# Patient Record
Sex: Female | Born: 1975 | Hispanic: Yes | Marital: Married | Smoking: Never smoker
Health system: Southern US, Community
[De-identification: ages and names within clinical notes are randomized; demographics above are authoritative.]

## PROBLEM LIST (undated history)

## (undated) DIAGNOSIS — G43909 Migraine, unspecified, not intractable, without status migrainosus: Secondary | ICD-10-CM

---

## 2004-05-04 ENCOUNTER — Emergency Department: Payer: Self-pay | Admitting: Emergency Medicine

## 2004-09-16 ENCOUNTER — Emergency Department: Payer: Self-pay | Admitting: Emergency Medicine

## 2004-10-08 ENCOUNTER — Emergency Department: Payer: Self-pay | Admitting: Emergency Medicine

## 2005-03-25 ENCOUNTER — Ambulatory Visit: Payer: Self-pay | Admitting: Urology

## 2005-05-09 ENCOUNTER — Observation Stay: Payer: Self-pay

## 2005-05-16 ENCOUNTER — Inpatient Hospital Stay: Payer: Self-pay | Admitting: Obstetrics and Gynecology

## 2006-06-23 ENCOUNTER — Encounter: Payer: Self-pay | Admitting: Specialist

## 2006-06-26 ENCOUNTER — Encounter: Payer: Self-pay | Admitting: Specialist

## 2007-01-11 ENCOUNTER — Emergency Department: Payer: Self-pay | Admitting: Emergency Medicine

## 2007-11-20 ENCOUNTER — Emergency Department: Payer: Self-pay | Admitting: Unknown Physician Specialty

## 2008-03-12 ENCOUNTER — Emergency Department: Payer: Self-pay | Admitting: Emergency Medicine

## 2008-07-21 ENCOUNTER — Ambulatory Visit: Payer: Self-pay | Admitting: Family Medicine

## 2008-09-23 ENCOUNTER — Emergency Department: Payer: Self-pay | Admitting: Emergency Medicine

## 2008-12-04 ENCOUNTER — Observation Stay: Payer: Self-pay

## 2008-12-18 ENCOUNTER — Observation Stay: Payer: Self-pay | Admitting: Obstetrics and Gynecology

## 2008-12-22 ENCOUNTER — Observation Stay: Payer: Self-pay

## 2009-01-12 ENCOUNTER — Observation Stay: Payer: Self-pay | Admitting: Obstetrics and Gynecology

## 2009-01-30 ENCOUNTER — Observation Stay: Payer: Self-pay | Admitting: Obstetrics and Gynecology

## 2009-02-11 ENCOUNTER — Inpatient Hospital Stay: Payer: Self-pay | Admitting: Obstetrics and Gynecology

## 2009-02-19 ENCOUNTER — Emergency Department: Payer: Self-pay

## 2011-12-25 ENCOUNTER — Other Ambulatory Visit: Payer: Self-pay | Admitting: Physician Assistant

## 2011-12-25 LAB — MAGNESIUM: Magnesium: 1.9 mg/dL

## 2012-06-26 ENCOUNTER — Emergency Department: Payer: Self-pay | Admitting: Emergency Medicine

## 2012-06-26 LAB — BASIC METABOLIC PANEL
Anion Gap: 5 — ABNORMAL LOW (ref 7–16)
Calcium, Total: 8.4 mg/dL — ABNORMAL LOW (ref 8.5–10.1)
Chloride: 107 mmol/L (ref 98–107)
Creatinine: 0.5 mg/dL — ABNORMAL LOW (ref 0.60–1.30)
Glucose: 117 mg/dL — ABNORMAL HIGH (ref 65–99)
Potassium: 3 mmol/L — ABNORMAL LOW (ref 3.5–5.1)
Sodium: 138 mmol/L (ref 136–145)

## 2012-06-26 LAB — CBC
HCT: 33.6 % — ABNORMAL LOW (ref 35.0–47.0)
MCH: 24.3 pg — ABNORMAL LOW (ref 26.0–34.0)
MCHC: 32.6 g/dL (ref 32.0–36.0)
MCV: 75 fL — ABNORMAL LOW (ref 80–100)
Platelet: 230 10*3/uL (ref 150–440)
RDW: 16.4 % — ABNORMAL HIGH (ref 11.5–14.5)

## 2012-07-02 ENCOUNTER — Ambulatory Visit: Payer: Self-pay | Admitting: General Practice

## 2012-10-11 ENCOUNTER — Emergency Department: Payer: Self-pay | Admitting: Emergency Medicine

## 2014-08-29 ENCOUNTER — Encounter: Payer: Self-pay | Admitting: *Deleted

## 2015-05-18 ENCOUNTER — Ambulatory Visit: Payer: Self-pay | Admitting: Physician Assistant

## 2015-07-16 ENCOUNTER — Encounter: Payer: Self-pay | Admitting: Physician Assistant

## 2015-07-16 ENCOUNTER — Ambulatory Visit: Payer: Self-pay | Admitting: Physician Assistant

## 2015-07-16 VITALS — BP 110/70 | HR 120 | Temp 98.7°F

## 2015-07-16 DIAGNOSIS — J101 Influenza due to other identified influenza virus with other respiratory manifestations: Secondary | ICD-10-CM

## 2015-07-16 DIAGNOSIS — R059 Cough, unspecified: Secondary | ICD-10-CM

## 2015-07-16 DIAGNOSIS — R05 Cough: Secondary | ICD-10-CM

## 2015-07-16 LAB — POCT INFLUENZA A/B
INFLUENZA B, POC: POSITIVE — AB
Influenza A, POC: NEGATIVE

## 2015-07-16 MED ORDER — HYDROCOD POLST-CPM POLST ER 10-8 MG/5ML PO SUER
5.0000 mL | Freq: Two times a day (BID) | ORAL | Status: DC
Start: 2015-07-16 — End: 2017-09-07

## 2015-07-16 MED ORDER — OSELTAMIVIR PHOSPHATE 75 MG PO CAPS: 75.0000 mg | ORAL_CAPSULE | Freq: Two times a day (BID) | ORAL | Status: DC

## 2015-07-16 NOTE — Progress Notes (Signed)
S: C/o runny nose and congestion for 2 days, headache, fever, chills,  Denies cp/sob, v/d; cough is hacking and deep, exposed to 2 + flu family members, one with strep; Using otc meds: tylenol, dayquil, benadryl  O: PE: vitals wnl except elevated hr ( norm for patient when taking cold meds)  perrl eomi, normocephalic, tms dull, nasal mucosa red and swollen, throat injected, neck supple no lymph, lungs c t a, cv rrr, neuro intact, flu swab +  A:  Acute bronchitis   P:  Tamiflu, tussionex , nr;  drink fluids, continue regular meds , use otc meds of choice, return if not improving in 5 days, return earlier if worsening

## 2015-10-02 ENCOUNTER — Telehealth: Payer: Self-pay | Admitting: Physician Assistant

## 2015-10-02 NOTE — Telephone Encounter (Signed)
Aloe would be the best and follow it with a good lotion, sunscreen from now on

## 2015-10-03 NOTE — Telephone Encounter (Signed)
Contacted patient per Bernerd LimboSusan Advised patient that Aloe is good and follow-up with lotion. Patient acknowledge understanding.

## 2017-02-28 ENCOUNTER — Encounter: Payer: Self-pay | Admitting: Emergency Medicine

## 2017-02-28 ENCOUNTER — Emergency Department
Admission: EM | Admit: 2017-02-28 | Discharge: 2017-02-28 | Disposition: A | Discharge status: Home / Self Care | Payer: Self-pay | Attending: Emergency Medicine | Admitting: Emergency Medicine

## 2017-02-28 DIAGNOSIS — G43709 Chronic migraine without aura, not intractable, without status migrainosus: Secondary | ICD-10-CM

## 2017-02-28 DIAGNOSIS — G43009 Migraine without aura, not intractable, without status migrainosus: Secondary | ICD-10-CM | POA: Insufficient documentation

## 2017-02-28 HISTORY — DX: Migraine, unspecified, not intractable, without status migrainosus: G43.909

## 2017-02-28 MED ORDER — BUTALBITAL-APAP-CAFFEINE 50-325-40 MG PO TABS: 1.0000 | ORAL_TABLET | Freq: Two times a day (BID) | ORAL | 0 refills | Status: DC | PRN

## 2017-02-28 MED ORDER — KETOROLAC TROMETHAMINE 60 MG/2ML IM SOLN
60.0000 mg | Freq: Once | INTRAMUSCULAR | Status: AC
  Administered 2017-02-28: 60 mg via INTRAMUSCULAR
  Filled 2017-02-28: qty 2

## 2017-02-28 MED ORDER — DIPHENHYDRAMINE HCL 50 MG/ML IJ SOLN
50.0000 mg | Freq: Once | INTRAMUSCULAR | Status: AC
  Administered 2017-02-28: 50 mg via INTRAMUSCULAR
  Filled 2017-02-28: qty 1

## 2017-02-28 MED ORDER — PROMETHAZINE HCL 25 MG/ML IJ SOLN
25.0000 mg | Freq: Once | INTRAMUSCULAR | Status: AC
  Administered 2017-02-28: 25 mg via INTRAMUSCULAR
  Filled 2017-02-28: qty 1

## 2017-02-28 NOTE — ED Triage Notes (Signed)
Migraine began about noon today. States history of migraines and same presentation as priors.

## 2017-02-28 NOTE — ED Provider Notes (Signed)
[facility] Emergency Department Provider Note  ____________________________________________  Time seen: Approximately 5:40 PM  I have reviewed the triage vital signs and the nursing notes.   HISTORY  Chief Complaint Migraine    HPI Amanda Weeks is a 41 y.o. female Who presents emergency department complaining of a migraine headache. Patient reports that she gets several migraines a month. Typically she takes tramadol and ibuprofen and symptoms go away. Patient reports that today's symptoms have not improved with her normal medications. Patient has never seen urology for her migraines. Patient denies thunderclapheadache, numbness or tingling, weakness. No recent trauma. No visual changes. No neck pain or stiffness. No chest pain, shortness of breath sounds on pain, nausea or vomiting. Patient reports that migraine is slightly worse than normal and has not resolved with normal medications.   Past Medical History:  Diagnosis Date  . Migraines     There are no active problems to display for this patient.   Past Surgical History:  Procedure Laterality Date  . CESAREAN SECTION      Prior to Admission medications   Medication Sig Start Date End Date Taking? Authorizing Provider  butalbital-acetaminophen-caffeine (FIORICET, ESGIC) 50-325-40 MG tablet Take 1 tablet by mouth 2 (two) times daily as needed for migraine. 02/28/17   Cuthriell, Delorise Royals, PA-C  chlorpheniramine-HYDROcodone (TUSSIONEX PENNKINETIC ER) 10-8 MG/5ML SUER Take 5 mLs by mouth 2 (two) times daily. 07/16/15   Sherrie Mustache Roselyn Bering, PA-C  oseltamivir (TAMIFLU) 75 MG capsule Take 1 capsule (75 mg total) by mouth 2 (two) times daily. 07/16/15   Faythe Ghee, PA-C    Allergies Patient has no known allergies.  No family history on file.  Social History Social History  Substance Use Topics  . Smoking status: Never Smoker  . Smokeless tobacco: Not on file  . Alcohol use Not on file      Review of Systems  Constitutional: No fever/chills Eyes: No visual changes. No discharge ENT: No upper respiratory complaints. Cardiovascular: no chest pain. Respiratory: no cough. No SOB. Gastrointestinal: No abdominal pain.  No nausea, no vomiting.  No diarrhea.  No constipation. Musculoskeletal: Negative for musculoskeletal pain. Skin: Negative for rash, abrasions, lacerations, ecchymosis. Neurological: positive for migraine headache but denies focal weakness or numbness. 10-point ROS otherwise negative.  ____________________________________________   PHYSICAL EXAM:  VITAL SIGNS: ED Triage Vitals  Enc Vitals Group     BP 02/28/17 1718 (!) 117/59     Pulse Rate 02/28/17 1718 87     Resp 02/28/17 1718 18     Temp 02/28/17 1718 98.3 F (36.8 C)     Temp Source 02/28/17 1718 Oral     SpO2 02/28/17 1718 98 %     Weight 02/28/17 1720 190 lb (86.2 kg)     Height 02/28/17 1720  (1.575 m)     Head Circumference --      Peak Flow --      Pain Score 02/28/17 1718 10     Pain Loc --      Pain Edu? --      Excl. in GC? --      Constitutional: Alert and oriented. Well appearing and in no acute distress. Eyes: Conjunctivae are normal. PERRL. EOMI. Head: Atraumatic. ENT:      Ears:       Nose: No congestion/rhinnorhea.      Mouth/Throat: Mucous membranes are moist.  Neck: No stridor. Neck is supple with full range of motion  Cardiovascular: Normal  rate, regular rhythm. Normal S1 and S2.  Good peripheral circulation. Respiratory: Normal respiratory effort without tachypnea or retractions. Lungs CTAB. Good air entry to the bases with no decreased or absent breath sounds. Musculoskeletal: Full range of motion to all extremities. No gross deformities appreciated. Neurologic:  Normal speech and language. No gross focal neurologic deficits are appreciated. Cranial nerves II through XII grossly intact. Negative Romberg's. Negative pronator drift. Equal grip strength  bilaterally. Sensation intact all 4 extremities. Skin:  Skin is warm, dry and intact. No rash noted. Psychiatric: Mood and affect are normal. Speech and behavior are normal. Patient exhibits appropriate insight and judgement.   ____________________________________________   LABS (all labs ordered are listed, but only abnormal results are displayed)  Labs Reviewed - No data to display ____________________________________________  EKG   ____________________________________________  RADIOLOGY   No results found.  ____________________________________________    PROCEDURES  Procedure(s) performed:    Procedures    Medications  diphenhydrAMINE (BENADRYL) injection 50 mg (50 mg Intramuscular Given 02/28/17 1809)  promethazine (PHENERGAN) injection 25 mg (25 mg Intramuscular Given 02/28/17 1807)  ketorolac (TORADOL) injection 60 mg (60 mg Intramuscular Given 02/28/17 1809)     ____________________________________________   INITIAL IMPRESSION / ASSESSMENT AND PLAN / ED COURSE  Pertinent labs & imaging results that were available during my care of the patient were reviewed by me and considered in my medical decision making (see chart for details).  Review of the [place] CSRS was performed in accordance of the [facility] prior to dispensing any controlled drugs.     Patient's diagnosis is consistent with igraine headache. Patient presents with migraine that is not responsive to normal medications. No concerning symptoms. Exam is reassuring. At this time, no indication for labs or imaging. Patient is given migraine cocktail in the emergency department.. Patient will be discharged home with prescriptions for Fioricet. Patient is to follow up with neurology as needed or otherwise directed. Patient is given ED precautions to return to the ED for any worsening or new symptoms.     ____________________________________________  FINAL CLINICAL IMPRESSION(S) / ED DIAGNOSES  Final  diagnoses:  Chronic migraine without aura without status migrainosus, not intractable      NEW MEDICATIONS STARTED DURING THIS VISIT:  New Prescriptions   BUTALBITAL-ACETAMINOPHEN-CAFFEINE (FIORICET, ESGIC) 50-325-40 MG TABLET    Take 1 tablet by mouth 2 (two) times daily as needed for migraine.        This chart was dictated using voice recognition software/Dragon. Despite best efforts to proofread, errors can occur which can change the meaning. Any change was purely unintentional.    Racheal Patches, PA-C 02/28/17 1813    Sharman Cheek, MD 02/28/17 1843

## 2017-09-07 ENCOUNTER — Emergency Department
Admission: EM | Admit: 2017-09-07 | Discharge: 2017-09-07 | Disposition: A | Discharge status: Home / Self Care | Payer: Self-pay | Attending: Emergency Medicine | Admitting: Emergency Medicine

## 2017-09-07 ENCOUNTER — Encounter: Payer: Self-pay | Admitting: Emergency Medicine

## 2017-09-07 ENCOUNTER — Other Ambulatory Visit: Payer: Self-pay

## 2017-09-07 DIAGNOSIS — M25512 Pain in left shoulder: Secondary | ICD-10-CM | POA: Insufficient documentation

## 2017-09-07 MED ORDER — KETOROLAC TROMETHAMINE 30 MG/ML IJ SOLN
30.0000 mg | Freq: Once | INTRAMUSCULAR | Status: AC
  Administered 2017-09-07: 30 mg via INTRAMUSCULAR
  Filled 2017-09-07: qty 1

## 2017-09-07 MED ORDER — METHOCARBAMOL 500 MG PO TABS: ORAL_TABLET | ORAL | 0 refills | Status: DC

## 2017-09-07 MED ORDER — NAPROXEN 500 MG PO TABS: 500.0000 mg | ORAL_TABLET | Freq: Two times a day (BID) | ORAL | 0 refills | Status: DC

## 2017-09-07 MED ORDER — HYDROCODONE-ACETAMINOPHEN 5-325 MG PO TABS: 1.0000 | ORAL_TABLET | Freq: Four times a day (QID) | ORAL | 0 refills | Status: DC | PRN

## 2017-09-07 NOTE — ED Notes (Signed)
See triage note  Presents with pain to upper back  States pain is mainly to posterior left shoulder and upper back since Thursday   Denies any injury  States she has been using ibu/aleve and some flexeril which she had at home

## 2017-09-07 NOTE — ED Provider Notes (Signed)
[facility] Emergency Department Provider Note  ____________________________________________   First MD Initiated Contact with Patient 09/07/17 1018     (approximate)  I have reviewed the triage vital signs and the nursing notes.   HISTORY  Chief Complaint Back Pain  HPI Amanda Weeks is a 42 y.o. female is here with complaint of left shoulder blade pain that started approximately 3 days ago.  Patient states that she has been taking Flexeril for muscle spasms without any relief.  She has had issues with muscle spasms in the past.  She also is experiencing some radiculopathy down her left arm.  She denies any recent injury to her shoulder.  She denies any chest pain or shortness of breath.  She rates her pain as a 9/10.  She states that when she can get her arm in a certain position it does help with the pain.   Past Medical History:  Diagnosis Date  . Migraines     There are no active problems to display for this patient.   Past Surgical History:  Procedure Laterality Date  . CESAREAN SECTION      Prior to Admission medications   Medication Sig Start Date End Date Taking? Authorizing Provider  HYDROcodone-acetaminophen (NORCO/VICODIN) 5-325 MG tablet Take 1 tablet by mouth every 6 (six) hours as needed for moderate pain. 09/07/17   Tommi RumpsSummers, Filiberto Wamble L, PA-C  methocarbamol (ROBAXIN) 500 MG tablet Take 1 or 2 tablets every 6 hours as needed for muscle spasms. 09/07/17   Tommi RumpsSummers, Tabari Volkert L, PA-C  naproxen (NAPROSYN) 500 MG tablet Take 1 tablet (500 mg total) by mouth 2 (two) times daily with a meal. 09/07/17   Tommi RumpsSummers, Jozsef Wescoat L, PA-C    Allergies Patient has no known allergies.  No family history on file.  Social History Social History   Tobacco Use  . Smoking status: Never Smoker  Substance Use Topics  . Alcohol use: Yes    Alcohol/week: 0.0 oz    Comment: Occasional  . Drug use: Never    Review of Systems Constitutional: No  fever/chills Cardiovascular: Denies chest pain. Respiratory: Denies shortness of breath. Musculoskeletal: Positive for left shoulder pain.  Positive for radiculopathy left arm. Skin: Negative for rash. Neurological: Negative for headaches, focal weakness or numbness. ____________________________________________   PHYSICAL EXAM:  VITAL SIGNS: ED Triage Vitals  Enc Vitals Group     BP 09/07/17 0934 136/65     Pulse Rate 09/07/17 0934 (!) 104     Resp 09/07/17 0934 20     Temp 09/07/17 0934 97.6 F (36.4 C)     Temp Source 09/07/17 0934 Oral     SpO2 09/07/17 0934 97 %     Weight 09/07/17 0935 195 lb (88.5 kg)     Height 09/07/17 0935 5\' 3"  (1.6 m)     Head Circumference --      Peak Flow --      Pain Score 09/07/17 0935 9     Pain Loc --      Pain Edu? --      Excl. in GC? --    Constitutional: Alert and oriented. Well appearing and in no acute distress. Eyes: Conjunctivae are normal.  Head: Atraumatic. Neck: No stridor.  No point tenderness on palpation of cervical spine posteriorly.  Patient is able to move in all 4 planes without any difficulty. Cardiovascular: Normal rate, regular rhythm. Grossly normal heart sounds.  Good peripheral circulation. Respiratory: Normal respiratory effort.  No retractions.  Lungs CTAB. Musculoskeletal: On examination of left shoulder there is no gross deformity.  There is point tenderness on palpation of the left trapezius and rhomboid muscle.  Pain is reproduced with extension and abduction of the left arm.  Muscles are firm to touch but no active muscle spasms were noted other than with range of motion.  Skin is intact.  No discoloration present.  Motor sensory function intact in the left arm.  Grip strength is equal bilaterally. Neurologic:  Normal speech and language. No gross focal neurologic deficits are appreciated.  Skin:  Skin is warm, dry and intact. No rash noted. Psychiatric: Mood and affect are normal. Speech and behavior are  normal.  ____________________________________________   LABS (all labs ordered are listed, but only abnormal results are displayed)  Labs Reviewed - No data to display   PROCEDURES  Procedure(s) performed: None  Procedures  Critical Care performed: No  ____________________________________________   INITIAL IMPRESSION / ASSESSMENT AND PLAN / ED COURSE Patient presents to the emergency department with left shoulder pain for 3 days.  Patient has been taking Flexeril without any relief.  She was placed in a sling for support.  She also received a Toradol injection.  She is to discontinue taking Flexeril at this time.  She was given a prescription for Robaxin, Norco as needed for severe pain and naproxen 500 mg twice daily with food.  She is instructed to use ice or heat to her shoulder as needed for comfort.  She is to follow-up with her PCP or [facility] acute care if any continued problems.  ____________________________________________   FINAL CLINICAL IMPRESSION(S) / ED DIAGNOSES  Final diagnoses:  Acute pain of left shoulder     ED Discharge Orders        Ordered    methocarbamol (ROBAXIN) 500 MG tablet     09/07/17 1125    HYDROcodone-acetaminophen (NORCO/VICODIN) 5-325 MG tablet  Every 6 hours PRN     09/07/17 1125    naproxen (NAPROSYN) 500 MG tablet  2 times daily with meals     09/07/17 1125       Note:  This document was prepared using Dragon voice recognition software and may include unintentional dictation errors.    Tommi Rumps, PA-C 09/07/17 1537    Jeanmarie Plant, MD 09/07/17 [phone]

## 2017-09-07 NOTE — ED Triage Notes (Addendum)
Patient complaining of pain under left shoulder blade started on Friday, getting worse. Rates pain a 9/10.  Taking Flexeril without relief.  Tingling radiating down left arm.  Denies injury.

## 2017-09-07 NOTE — Discharge Instructions (Addendum)
[facility] if any continued problems with her shoulder.  Also discontinue taking Flexeril.  Begin taking Robaxin 1 or 2 tablets every 6 hours as needed for muscle spasms.  Norco 1 every 6 hours as needed for moderate pain.  Do not drive while taking both the Robaxin and Norco as this may cause drowsiness and increase your risk for injury.  Naproxen 500 mg twice daily with food.  Use moist heat or ice to your shoulder as needed and wear sling for 2-3 days for support.

## 2017-09-09 ENCOUNTER — Emergency Department: Payer: Self-pay

## 2017-09-09 ENCOUNTER — Emergency Department
Admission: EM | Admit: 2017-09-09 | Discharge: 2017-09-09 | Disposition: A | Discharge status: Home / Self Care | Payer: Self-pay | Attending: Emergency Medicine | Admitting: Emergency Medicine

## 2017-09-09 ENCOUNTER — Other Ambulatory Visit: Payer: Self-pay

## 2017-09-09 ENCOUNTER — Encounter: Payer: Self-pay | Admitting: Emergency Medicine

## 2017-09-09 DIAGNOSIS — M5413 Radiculopathy, cervicothoracic region: Secondary | ICD-10-CM | POA: Insufficient documentation

## 2017-09-09 LAB — COMPREHENSIVE METABOLIC PANEL
ALK PHOS: 61 U/L (ref 38–126)
ALT: 28 U/L (ref 14–54)
AST: 28 U/L (ref 15–41)
Albumin: 4.3 g/dL (ref 3.5–5.0)
Anion gap: 7 (ref 5–15)
BILIRUBIN TOTAL: 0.5 mg/dL (ref 0.3–1.2)
BUN: 12 mg/dL (ref 6–20)
CALCIUM: 9.2 mg/dL (ref 8.9–10.3)
CO2: 25 mmol/L (ref 22–32)
CREATININE: 0.47 mg/dL (ref 0.44–1.00)
Chloride: 104 mmol/L (ref 101–111)
GFR calc Af Amer: >60 mL/min (ref >60)
Glucose, Bld: 124 mg/dL — ABNORMAL HIGH (ref 65–99)
Potassium: 4.1 mmol/L (ref 3.5–5.1)
Sodium: 136 mmol/L (ref 135–145)
TOTAL PROTEIN: 8.4 g/dL — AB (ref 6.5–8.1)

## 2017-09-09 LAB — CBC WITH DIFFERENTIAL/PLATELET
Basophils Absolute: 0.1 10*3/uL (ref 0–0.1)
Basophils Relative: 1 %
Eosinophils Absolute: 0.1 10*3/uL (ref 0–0.7)
Eosinophils Relative: 1 %
HEMATOCRIT: 35.7 % (ref 35.0–47.0)
HEMOGLOBIN: 11.8 g/dL — AB (ref 12.0–16.0)
LYMPHS ABS: 0.9 10*3/uL — AB (ref 1.0–3.6)
Lymphocytes Relative: 10 %
MCH: 26.6 pg (ref 26.0–34.0)
MCHC: 33 g/dL (ref 32.0–36.0)
MCV: 80.7 fL (ref 80.0–100.0)
MONO ABS: 0.3 10*3/uL (ref 0.2–0.9)
Monocytes Relative: 3 %
NEUTROS ABS: 8.4 10*3/uL — AB (ref 1.4–6.5)
NEUTROS PCT: 85 %
Platelets: 331 10*3/uL (ref 150–440)
RBC: 4.43 MIL/uL (ref 3.80–5.20)
RDW: 15.6 % — AB (ref 11.5–14.5)
WBC: 9.8 10*3/uL (ref 3.6–11.0)

## 2017-09-09 LAB — TROPONIN I: Troponin I: <0.03 ng/mL (ref <0.03)

## 2017-09-09 LAB — CK: CK TOTAL: 154 U/L (ref 38–234)

## 2017-09-09 MED ORDER — LIDOCAINE 4 % EX PTCH: 1.0000 | MEDICATED_PATCH | Freq: Every day | CUTANEOUS | 0 refills | Status: DC

## 2017-09-09 MED ORDER — HYDROCODONE-ACETAMINOPHEN 5-325 MG PO TABS: 1.0000 | ORAL_TABLET | Freq: Four times a day (QID) | ORAL | 0 refills | Status: DC | PRN

## 2017-09-09 MED ORDER — METHYLPREDNISOLONE SODIUM SUCC 125 MG IJ SOLR
125.0000 mg | Freq: Once | INTRAMUSCULAR | Status: AC
  Administered 2017-09-09: 125 mg via INTRAMUSCULAR
  Filled 2017-09-09: qty 2

## 2017-09-09 MED ORDER — OXYCODONE HCL 5 MG PO TABS
5.0000 mg | ORAL_TABLET | Freq: Once | ORAL | Status: AC
  Administered 2017-09-09: 5 mg via ORAL
  Filled 2017-09-09: qty 1

## 2017-09-09 MED ORDER — DIAZEPAM 5 MG PO TABS
5.0000 mg | ORAL_TABLET | Freq: Once | ORAL | Status: AC
  Administered 2017-09-09: 5 mg via ORAL
  Filled 2017-09-09: qty 1

## 2017-09-09 MED ORDER — TIZANIDINE HCL 4 MG PO TABS: 4.0000 mg | ORAL_TABLET | Freq: Three times a day (TID) | ORAL | 0 refills | Status: DC

## 2017-09-09 MED ORDER — LIDOCAINE 5 % EX PTCH
1.0000 | MEDICATED_PATCH | CUTANEOUS | Status: DC
  Administered 2017-09-09: 1 via TRANSDERMAL
  Filled 2017-09-09: qty 1

## 2017-09-09 MED ORDER — PREDNISONE 10 MG (21) PO TBPK: ORAL_TABLET | ORAL | 0 refills | Status: DC

## 2017-09-09 MED ORDER — KETOROLAC TROMETHAMINE 30 MG/ML IJ SOLN
30.0000 mg | Freq: Once | INTRAMUSCULAR | Status: AC
  Administered 2017-09-09: 30 mg via INTRAMUSCULAR
  Filled 2017-09-09: qty 1

## 2017-09-09 NOTE — ED Triage Notes (Signed)
Patient returns with complaint of thoracic back pain under shoulder blade, up into neck.  Patient seen here on 4/15 with same complaint - tingling down left arm is new since then.  Patient grimacing and rocking during triage.

## 2017-09-09 NOTE — ED Provider Notes (Signed)
[facility] Emergency Department Provider Note ____________________________________________  Time seen: Approximately 1:31 PM  I have reviewed the triage vital signs and the nursing notes.  HISTORY  Chief Complaint Back Pain and Torticollis   HPI Amanda Weeks is a 42 y.o. female who presents to the emergency department for treatment and evaluation of thoracic back pain and neck pain.  Patient was seen here on 09/07/2017 for the same.  She states that the tingling in her left arm is much worse.  She has had no relief with the Flexeril, Naprosyn, and Norco.  No specific injury.  Past Medical History:  Diagnosis Date  . Migraines     There are no active problems to display for this patient.   Past Surgical History:  Procedure Laterality Date  . CESAREAN SECTION      Prior to Admission medications   Medication Sig Start Date End Date Taking? Authorizing Provider  HYDROcodone-acetaminophen (NORCO/VICODIN) 5-325 MG tablet Take 1 tablet by mouth every 6 (six) hours as needed for moderate pain. 09/09/17   Shavaun Osterloh B, FNP  Lidocaine 4 % PTCH Apply 1 patch topically daily. 09/09/17   Smayan Hackbart, Rulon Eisenmengerari B, FNP  methocarbamol (ROBAXIN) 500 MG tablet Take 1 or 2 tablets every 6 hours as needed for muscle spasms. 09/07/17   Tommi RumpsSummers, Rhonda L, PA-C  naproxen (NAPROSYN) 500 MG tablet Take 1 tablet (500 mg total) by mouth 2 (two) times daily with a meal. 09/07/17   Tommi RumpsSummers, Rhonda L, PA-C  predniSONE (STERAPRED UNI-PAK 21 TAB) 10 MG (21) TBPK tablet Take 6 tablets on day 1 Take 5 tablets on day 2 Take 4 tablets on day 3 Take 3 tablets on day 4 Take 2 tablets on day 5 Take 1 tablet on day 6 09/09/17   Orlean Holtrop B, FNP  tiZANidine (ZANAFLEX) 4 MG tablet Take 1 tablet (4 mg total) by mouth 3 (three) times daily. 09/09/17   Chinita Pesterriplett, Derrick Tiegs B, FNP    Allergies Patient has no known allergies.  No family history on file.  Social History Social History   Tobacco  Use  . Smoking status: Never Smoker  . Smokeless tobacco: Never Used  Substance Use Topics  . Alcohol use: Yes    Alcohol/week: 0.0 oz    Comment: Occasional  . Drug use: Never    Review of Systems Constitutional: Well appearing. Respiratory: Negative for dyspnea. Cardiovascular: Negative for change in skin temperature or color. Musculoskeletal:   Negative for chronic steroid use   Negative for trauma in the presence of osteoporosis  Negative for age over 4650 and trauma.  Negative for constitutional symptoms, or history of cancer   Negative for pain worse at night. Skin: Negative for rash, lesion, or wound.  Genitourinary: Negative for urinary retention. Rectal: Negative for fecal incontinence or new onset constipation/bowel habit changes. Hematological/Immunilogical: Negative for immunosuppression, IV drug use, or fever Neurological: Positive for burning, tingling, numb, electric, radiating pain in the left arm.                        Negative for saddle anesthesia.                        Negative for focal neurologic deficit, progressive or disabling symptoms             Negative for saddle anesthesia. ____________________________________________   PHYSICAL EXAM:  VITAL SIGNS: ED Triage Vitals  Enc Vitals Group  BP 09/09/17 1145 125/63     Pulse Rate 09/09/17 1145 91     Resp 09/09/17 1145 20     Temp 09/09/17 1145 98 F (36.7 C)     Temp Source 09/09/17 1145 Oral     SpO2 09/09/17 1145 96 %     Weight 09/09/17 1147 195 lb (88.5 kg)     Height 09/09/17 1147 5\' 3"  (1.6 m)     Head Circumference --      Peak Flow --      Pain Score 09/09/17 1147 9     Pain Loc --      Pain Edu? --      Excl. in GC? --     Constitutional: Alert and oriented. Well appearing and in no acute distress. Eyes: Conjunctivae are clear without discharge or drainage.  Head: Atraumatic. Neck: Full, active range of motion. Respiratory: Respirations even and unlabored. Musculoskeletal:  Limited abduction of the left arm secondary to pain, Strength 4/5 of the left upper extremity Neurologic: Reflexes of the lower extremities are 2+.  Negative straight leg raise on the right or left side. Skin: Atraumatic.  Psychiatric: Behavior and affect are normal.  ____________________________________________   LABS (all labs ordered are listed, but only abnormal results are displayed)  Labs Reviewed  CBC WITH DIFFERENTIAL/PLATELET - Abnormal; Notable for the following components:      Result Value   Hemoglobin 11.8 (*)    RDW 15.6 (*)    Neutro Abs 8.4 (*)    Lymphs Abs 0.9 (*)    All other components within normal limits  COMPREHENSIVE METABOLIC PANEL - Abnormal; Notable for the following components:   Glucose, Bld 124 (*)    Total Protein 8.4 (*)    All other components within normal limits  TROPONIN I  CK   ____________________________________________  RADIOLOGY  CT of the cervical and thoracic spine are negative for acute abnormality per radiology. ____________________________________________   PROCEDURES  Procedure(s) performed:  Procedures ____________________________________________   INITIAL IMPRESSION / ASSESSMENT AND PLAN / ED COURSE  Amanda Weeks is a 42 y.o. female who presents to the emergency department for treatment and evaluation of thoracic, suprascapular pain that radiates up into her neck.  CT of the cervical spine and thoracic spine are both negative for acute abnormality per radiology.  Patient has had little relief with Toradol, Solu-Medrol, lidocaine patch, Roxicet, and Valium.  Because pain is in the left arm, cardiac workup will be initiated although she does not have any significant personal cardiac history.  ----------------------------------------- 4:30 PM on 09/09/2017 -----------------------------------------  Labs, EKG, and CT scans are all unremarkable. Patient still complains of left arm pain. She is to follow up with  [facility] if not improving over the next few days. She is to return to the ER for symptoms that change or worsen if unable to schedule an appointment.  Medications  lidocaine (LIDODERM) 5 % 1 patch (1 patch Transdermal Patch Applied 09/09/17 1407)  methylPREDNISolone sodium succinate (SOLU-MEDROL) 125 mg/2 mL injection 125 mg (125 mg Intramuscular Given 09/09/17 1306)  ketorolac (TORADOL) 30 MG/ML injection 30 mg (30 mg Intramuscular Given 09/09/17 1307)  oxyCODONE (Oxy IR/ROXICODONE) immediate release tablet 5 mg (5 mg Oral Given 09/09/17 1406)  diazepam (VALIUM) tablet 5 mg (5 mg Oral Given 09/09/17 1455)    ED Discharge Orders        Ordered    predniSONE (STERAPRED UNI-PAK 21 TAB) 10 MG (21) TBPK tablet  09/09/17 1433    tiZANidine (ZANAFLEX) 4 MG tablet  3 times daily     09/09/17 1433    Lidocaine 4 % PTCH  Daily     09/09/17 1433    HYDROcodone-acetaminophen (NORCO/VICODIN) 5-325 MG tablet  Every 6 hours PRN     09/09/17 1629       Pertinent labs & imaging results that were available during my care of the patient were reviewed by me and considered in my medical decision making (see chart for details).  _________________________________________   FINAL CLINICAL IMPRESSION(S) / ED DIAGNOSES  Final diagnoses:  Radiculopathy of cervicothoracic region     If controlled substance prescribed during this visit, 12 month history viewed on the [place] prior to issuing an initial prescription for Schedule II or III opiod.    Chinita Pester, FNP 09/09/17 1633    Darci Current, MD 09/10/17 [phone]

## 2017-09-09 NOTE — Discharge Instructions (Addendum)
Please follow up with orthopedics if not improving over the week. Return to the ER for symptoms that change or worsen or for new complaints if unable to see your primary care provider or orthopedics.

## 2017-09-17 ENCOUNTER — Encounter: Payer: Self-pay | Admitting: Emergency Medicine

## 2017-09-17 ENCOUNTER — Emergency Department
Admission: EM | Admit: 2017-09-17 | Discharge: 2017-09-17 | Disposition: A | Discharge status: Home / Self Care | Payer: Self-pay | Attending: Emergency Medicine | Admitting: Emergency Medicine

## 2017-09-17 DIAGNOSIS — M5412 Radiculopathy, cervical region: Secondary | ICD-10-CM | POA: Insufficient documentation

## 2017-09-17 MED ORDER — PREDNISONE 10 MG (21) PO TBPK: ORAL_TABLET | ORAL | 0 refills | Status: DC

## 2017-09-17 NOTE — ED Triage Notes (Signed)
Patient is complaining of left arm pain and numbness x 1 week.  Patient was seen in the ED approx. 1 week ago for the same and states pain/numbness is not getting better.  Patient is rubbing her left arm. Patient reports history of "back problems" and states she strained her back recently.  Denies any numbness or tingling to face or left leg.

## 2017-09-17 NOTE — ED Provider Notes (Signed)
[facility] Emergency Department Provider Note  ___________________________________________   First MD Initiated Contact with Patient 09/17/17 [id number]     (approximate)  I have reviewed the triage vital signs and the nursing notes.   HISTORY  Chief Complaint Arm Pain   HPI Amanda Weeks is a 42 y.o. female presents to the emergency department with left arm pain with radiculopathy for 1 week.  Patient was seen in the emergency department for same symptoms prior to today and states that it does not appear to be getting any better except for in her back.  She states that she recently got a massage which helped her back issues but did not help with her arm.  She rates her pain as a 7 out of 10.   Past Medical History:  Diagnosis Date  . Migraines     There are no active problems to display for this patient.   Past Surgical History:  Procedure Laterality Date  . CESAREAN SECTION      Prior to Admission medications   Medication Sig Start Date End Date Taking? Authorizing Provider  predniSONE (STERAPRED UNI-PAK 21 TAB) 10 MG (21) TBPK tablet Take 6 tablets on day 1 Take 5 tablets on day 2 Take 4 tablets on day 3 Take 3 tablets on day 4 Take 2 tablets on day 5 Take 1 tablet on day 6 09/17/17   Tommi Rumps, PA-C    Allergies Patient has no known allergies.  No family history on file.  Social History Social History   Tobacco Use  . Smoking status: Never Smoker  . Smokeless tobacco: Never Used  Substance Use Topics  . Alcohol use: Yes    Alcohol/week: 0.0 oz    Comment: Occasional  . Drug use: Never    Review of Systems Constitutional: No fever/chills Eyes: No visual changes. ENT: No sore throat. Cardiovascular: Denies chest pain. Respiratory: Denies shortness of breath. Musculoskeletal: Positive for left arm and shoulder pain. Skin: Negative for ecchymosis or abrasions. Neurological: Negative for headaches, focal weakness or  numbness. ____________________________________________   PHYSICAL EXAM:  VITAL SIGNS: ED Triage Vitals [09/17/17 0748]  Enc Vitals Group     BP (!) 135/59     Pulse Rate 92     Resp 20     Temp 98.4 F (36.9 C)     Temp Source Oral     SpO2 99 %     Weight 195 lb (88.5 kg)     Height 5\' 3"  (1.6 m)     Head Circumference      Peak Flow      Pain Score 7     Pain Loc      Pain Edu?      Excl. in GC?    Constitutional: Alert and oriented. Well appearing and in no acute distress. Eyes: Conjunctivae are normal.  Head: Atraumatic. Neck: No stridor.  No point tenderness on palpation cervical spine posteriorly.  Range of motion is without restriction. Cardiovascular: Normal rate, regular rhythm. Grossly normal heart sounds.  Good peripheral circulation. Respiratory: Normal respiratory effort.  No retractions. Lungs CTAB. Musculoskeletal: On examination of left shoulder there is no gross deformity and no crepitus with range of motion.  There is minimal to no tenderness on palpation of the parascapular muscles.  There is tenderness noted with palpation of the left trapezius muscles.  Range of motion of the neck is without restriction.  Good muscle strength to hands in comparison bilaterally.  Neurologic:  Normal speech and language. No gross focal neurologic deficits are appreciated.  Skin:  Skin is warm, dry and intact. No ecchymosis or abrasions noted. Psychiatric: Mood and affect are normal. Speech and behavior are normal.  ____________________________________________   LABS (all labs ordered are listed, but only abnormal results are displayed)  Labs Reviewed - No data to display  PROCEDURES  Procedure(s) performed: None  Procedures  Critical Care performed: No  ____________________________________________   INITIAL IMPRESSION / ASSESSMENT AND PLAN / ED COURSE  As part of my medical decision making, I reviewed the following data within the electronic MEDICAL RECORD NUMBER  Notes from prior ED visits and [place] Controlled Substance Database  On patient's last visit she was referred to an orthopedist which she has not made an appointment with.  We discussed follow-up with an orthopedist for her cervical radiculopathy.  Patient was given prednisone 60 mg 6-day taper.  She is to to call make an appointment with the orthopedist whose information was provided to her today.   ____________________________________________   FINAL CLINICAL IMPRESSION(S) / ED DIAGNOSES  Final diagnoses:  Cervical radiculopathy     ED Discharge Orders        Ordered    predniSONE (STERAPRED UNI-PAK 21 TAB) 10 MG (21) TBPK tablet     09/17/17 [id number]       Note:  This document was prepared using Dragon voice recognition software and may include unintentional dictation errors.    Tommi RumpsSummers, Rhonda L, PA-C 09/17/17 1552    Sharman CheekStafford, Phillip, MD 09/22/17 1540

## 2017-09-17 NOTE — Discharge Instructions (Addendum)
Make an appointment with the orthopedic clinic at [facility].  Use warm moist compresses or ice to your neck and shoulder as needed for discomfort.  Begin taking prednisone as directed over the course of the next 6 days tapering down.

## 2017-09-17 NOTE — ED Notes (Signed)
See triage note  States she was seen last week for pain pain  Was placed on steroids and muscle relaxers  States pain went away to back  Then couple of days ago she developed numbness to left arm  Grips equal

## 2017-11-27 ENCOUNTER — Encounter: Payer: Self-pay | Admitting: Family Medicine

## 2017-11-27 ENCOUNTER — Ambulatory Visit: Payer: Self-pay | Admitting: Family Medicine

## 2017-11-27 VITALS — BP 126/90 | HR 97 | Temp 98.5°F | Wt 193.0 lb

## 2017-11-27 DIAGNOSIS — J069 Acute upper respiratory infection, unspecified: Secondary | ICD-10-CM

## 2017-11-27 DIAGNOSIS — R0981 Nasal congestion: Secondary | ICD-10-CM

## 2017-11-27 MED ORDER — BENZONATATE 100 MG PO CAPS: 100.0000 mg | ORAL_CAPSULE | Freq: Three times a day (TID) | ORAL | 0 refills | Status: DC | PRN

## 2017-11-27 MED ORDER — PREDNISONE 20 MG PO TABS: 20.0000 mg | ORAL_TABLET | Freq: Every day | ORAL | 0 refills | Status: AC

## 2017-11-27 MED ORDER — LEVOCETIRIZINE DIHYDROCHLORIDE 5 MG PO TABS: 5.0000 mg | ORAL_TABLET | Freq: Every evening | ORAL | 0 refills | Status: DC

## 2017-11-27 NOTE — Progress Notes (Signed)
Patient ID: Amanda Weeks, female    DOB: 01/31/1976, 42 y.o.   MRN: [id number]  PCP: Patient, No Pcp Per  Chief Complaint  Patient presents with  . selfpay-nasal cong    Subjective:  HPI Amanda Weeks is a 42 y.o. female presents with a complaint of acute onset nasal congestion, cough, and bilateral ear pressure. Onset of symptoms, 1 day.  Complains of cough (nonproductive), nasal congestion, runny nose, sneezing, bilateral eye pressure, post nasal drainage, headache and sore throat. Denies shortness of breath, wheezing, chest tightness, or cough. She has attempted relief with 2 doses of Theraflu and one dose of Ibuprofen last night without significant improvement of symptoms. Social History   Socioeconomic History  . Marital status: Single    Spouse name: Not on file  . Number of children: Not on file  . Years of education: Not on file  . Highest education level: Not on file  Occupational History  . Not on file  Social Needs  . Financial resource strain: Not on file  . Food insecurity:    Worry: Not on file    Inability: Not on file  . Transportation needs:    Medical: Not on file    Non-medical: Not on file  Tobacco Use  . Smoking status: Never Smoker  . Smokeless tobacco: Never Used  Substance and Sexual Activity  . Alcohol use: Yes    Alcohol/week: 0.0 oz    Comment: Occasional  . Drug use: Never  . Sexual activity: Yes    Comment: BTL  Lifestyle  . Physical activity:    Days per week: Not on file    Minutes per session: Not on file  . Stress: Not on file  Relationships  . Social connections:    Talks on phone: Not on file    Gets together: Not on file    Attends religious service: Not on file    Active member of club or organization: Not on file    Attends meetings of clubs or organizations: Not on file    Relationship status: Not on file  . Intimate partner violence:    Fear of current or ex partner: Not on file    Emotionally abused: Not on  file    Physically abused: Not on file    Forced sexual activity: Not on file  Other Topics Concern  . Not on file  Social History Narrative  . Not on file    Review of Systems Pertinent negative listed in HPI  No Known Allergies  Prior to Admission medications   Medication Sig Start Date End Date Taking? Authorizing Provider  predniSONE (STERAPRED UNI-PAK 21 TAB) 10 MG (21) TBPK tablet Take 6 tablets on day 1 Take 5 tablets on day 2 Take 4 tablets on day 3 Take 3 tablets on day 4 Take 2 tablets on day 5 Take 1 tablet on day 6 Patient not taking: Reported on 11/27/2017 09/17/17   Tommi RumpsSummers, Rhonda L, PA-C    Past Medical, Surgical Family and Social History reviewed and updated.    Objective:   Today's Vitals   11/27/17 0858  BP: 126/90  Pulse: 97  Temp: 98.5 F (36.9 C)  SpO2: 99%  Weight: 193 lb (87.5 kg)    Wt Readings from Last 3 Encounters:  11/27/17 193 lb (87.5 kg)  09/17/17 195 lb (88.5 kg)  09/09/17 195 lb (88.5 kg)   Physical Exam  Constitutional: She is oriented to person, place, and time. She  appears well-developed and well-nourished.  HENT:  Right Ear: A middle ear effusion is present.  Left Ear: A middle ear effusion is present.  Nose: Mucosal edema, rhinorrhea and sinus tenderness present.  Mouth/Throat: Uvula is midline and oropharynx is clear and moist.  Eyes: Pupils are equal, round, and reactive to light. Conjunctivae and EOM are normal.  Neck: Normal range of motion. Neck supple.  Cardiovascular: Normal rate, regular rhythm and normal heart sounds.  Pulmonary/Chest: Effort normal and breath sounds normal.  Lymphadenopathy:    She has no cervical adenopathy.  Neurological: She is alert and oriented to person, place, and time.  Skin: Skin is warm and dry.   Assessment & Plan:  1. Sinus congestion 2. Viral upper respiratory tract infection Will continue conservative, symptomatic treatment as follows: Start prednisone 20 mg  X 3 days with  breakfast. Start atrovent nasal spray, 2 sprays per nares x 5-7 days. I am also prescribing Levocetirizine 5 mg once daily at bedtime.  Take levocetirizine consistently for 2 weeks to prevent abrupt return of symptoms. I am also prescribing benzonatate 100-200 mg 3 times daily as needed for cough.  Meds ordered this encounter  Medications  . predniSONE (DELTASONE) 20 MG tablet    Sig: Take 1 tablet (20 mg total) by mouth daily with breakfast for 3 days.    Dispense:  3 tablet    Refill:  0  . benzonatate (TESSALON) 100 MG capsule    Sig: Take 1-2 capsules (100-200 mg total) by mouth 3 (three) times daily as needed for cough.    Dispense:  40 capsule    Refill:  0  . levocetirizine (XYZAL) 5 MG tablet    Sig: Take 1 tablet (5 mg total) by mouth every evening.    Dispense:  14 tablet    Refill:  0    If symptoms worsen or do not improve, return for follow-up, follow-up with PCP, or at the emergency department if severity of symptoms warrant a higher level of care.     Godfrey Pick. Tiburcio Pea, MSN, FNP-C [facility]  [address]  [place], [place] [address] [phone]

## 2017-11-27 NOTE — Patient Instructions (Addendum)
Start prednisone 20 mg  X 3 days with breakfast. Start atrovent nasal spray, 2 sprays per nares x 5-7 days. I am also prescribing Levocetirizine 5 mg once daily at bedtime.  Take levocetirizine consistently for 2 weeks to prevent abrupt return of symptoms. I am also prescribing benzonatate 100-200 mg 3 times daily as needed for cough.  If symptoms worsen or do not improve, return for further evaluation.   Viral Respiratory Infection A viral respiratory infection is an illness that affects parts of the body used for breathing, like the lungs, nose, and throat. It is caused by a germ called a virus. Some examples of this kind of infection are:  A cold.  The flu (influenza).  A respiratory syncytial virus (RSV) infection.  How do I know if I have this infection? Most of the time this infection causes:  A stuffy or runny nose.  Yellow or green fluid in the nose.  A cough.  Sneezing.  Tiredness (fatigue).  Achy muscles.  A sore throat.  Sweating or chills.  A fever.  A headache.  How is this infection treated? If the flu is diagnosed early, it may be treated with an antiviral medicine. This medicine shortens the length of time a person has symptoms. Symptoms may be treated with over-the-counter and prescription medicines, such as:  Expectorants. These make it easier to cough up mucus.  Decongestant nasal sprays.  Doctors do not prescribe antibiotic medicines for viral infections. They do not work with this kind of infection. How do I know if I should stay home? To keep others from getting sick, stay home if you have:  A fever.  A lasting cough.  A sore throat.  A runny nose.  Sneezing.  Muscles aches.  Headaches.  Tiredness.  Weakness.  Chills.  Sweating.  An upset stomach (nausea).  Follow these instructions at home:  Rest as much as possible.  Take over-the-counter and prescription medicines only as told by your doctor.  Drink enough fluid  to keep your pee (urine) clear or pale yellow.  Gargle with salt water. Do this 3-4 times per day or as needed. To make a salt-water mixture, dissolve -1 tsp of salt in 1 cup of warm water. Make sure the salt dissolves all the way.  Use nose drops made from salt water. This helps with stuffiness (congestion). It also helps soften the skin around your nose.  Do not drink alcohol.  Do not use tobacco products, including cigarettes, chewing tobacco, and e-cigarettes. If you need help quitting, ask your doctor. Get help if:  Your symptoms last for 10 days or longer.  Your symptoms get worse over time.  You have a fever.  You have very bad pain in your face or forehead.  Parts of your jaw or neck become very swollen. Get help right away if:  You feel pain or pressure in your chest.  You have shortness of breath.  You faint or feel like you will faint.  You keep throwing up (vomiting).  You feel confused. This information is not intended to replace advice given to you by your health care provider. Make sure you discuss any questions you have with your health care provider. Document Released: 04/24/2008 Document Revised: 10/18/2015 Document Reviewed: 10/18/2014 Elsevier Interactive Patient Education  2018 [employer]

## 2017-11-30 ENCOUNTER — Telehealth: Payer: Self-pay | Admitting: Emergency Medicine

## 2017-11-30 ENCOUNTER — Other Ambulatory Visit: Payer: Self-pay | Admitting: Family Medicine

## 2017-11-30 MED ORDER — AZITHROMYCIN 250 MG PO TABS: ORAL_TABLET | ORAL | 0 refills | Status: DC

## 2017-11-30 NOTE — Telephone Encounter (Signed)
Spoke with patient still feeling about the same finished steroid yesterday and is  continuing taking xyzal and tessalon perls as instructed.

## 2017-11-30 NOTE — Progress Notes (Signed)
Patient reports no improvement of condition with current medication regimen. E-prescribed Zpak and recommended OTC delsym for anti-tussive therapy.   Godfrey PickKimberly S. Tiburcio PeaHarris, MSN, FNP-C [facility]   [address]  [place], [place] [address] [phone]

## 2017-12-02 ENCOUNTER — Telehealth: Payer: Self-pay | Admitting: Family Medicine

## 2017-12-02 NOTE — Telephone Encounter (Signed)
Patient contacted office this morning stating that she has started antibiotic but now her ear is clogged still not feeling better.with an persistent cough. Please advise

## 2017-12-02 NOTE — Telephone Encounter (Signed)
Advise patient that she has been prescribed antibiotics, prednisone, and anti-cough medication for treatment of her condition and symptoms. A cough following treatment of an respiratory infection can linger up to six weeks. Ear clogging is most effectively managed with over the counter antihistamines such as consistently taking the prescribed Xyzal and she can add over the counter Flonase. Also she has not given the antibiotics sufficient time to work. I recommend staying hydrated with water as well. There no additional medication treatment warranted at present. If she is concern that her cough has worsened since her last visit, I recommend that she follows-up with her PCP for further evaluation of other causes aside from a respiratory infection.  Godfrey PickKimberly S. Tiburcio PeaHarris, MSN, FNP-C [facility]  [address] [place], [place] [address] [phone]

## 2017-12-02 NOTE — Telephone Encounter (Signed)
Contacted patient and instructed her per provider to continue with antibiotic finis up add flonase otc and drink plenty of water if not any better see PCP. Also cough will sometimes linger on up to 6 wks. Patient acknowledge understanding.

## 2018-06-28 ENCOUNTER — Emergency Department
Admission: EM | Admit: 2018-06-28 | Discharge: 2018-06-28 | Disposition: A | Discharge status: Home / Self Care | Payer: Self-pay | Attending: Student in an Organized Health Care Education/Training Program | Admitting: Student in an Organized Health Care Education/Training Program

## 2018-06-28 ENCOUNTER — Emergency Department: Payer: Self-pay

## 2018-06-28 ENCOUNTER — Encounter: Payer: Self-pay | Admitting: Emergency Medicine

## 2018-06-28 ENCOUNTER — Other Ambulatory Visit: Payer: Self-pay

## 2018-06-28 DIAGNOSIS — J111 Influenza due to unidentified influenza virus with other respiratory manifestations: Secondary | ICD-10-CM | POA: Insufficient documentation

## 2018-06-28 DIAGNOSIS — M791 Myalgia, unspecified site: Secondary | ICD-10-CM | POA: Insufficient documentation

## 2018-06-28 DIAGNOSIS — R05 Cough: Secondary | ICD-10-CM | POA: Insufficient documentation

## 2018-06-28 DIAGNOSIS — R0981 Nasal congestion: Secondary | ICD-10-CM | POA: Insufficient documentation

## 2018-06-28 DIAGNOSIS — Z79899 Other long term (current) drug therapy: Secondary | ICD-10-CM | POA: Insufficient documentation

## 2018-06-28 MED ORDER — IPRATROPIUM-ALBUTEROL 0.5-2.5 (3) MG/3ML IN SOLN
3.0000 mL | Freq: Once | RESPIRATORY_TRACT | Status: AC
  Administered 2018-06-28: 3 mL via RESPIRATORY_TRACT
  Filled 2018-06-28: qty 3

## 2018-06-28 MED ORDER — IBUPROFEN 600 MG PO TABS: 600.0000 mg | ORAL_TABLET | Freq: Four times a day (QID) | ORAL | 0 refills | Status: DC | PRN

## 2018-06-28 MED ORDER — ACETAMINOPHEN 500 MG PO TABS: 500.0000 mg | ORAL_TABLET | Freq: Four times a day (QID) | ORAL | 0 refills | Status: AC | PRN

## 2018-06-28 MED ORDER — BENZONATATE 100 MG PO CAPS
100.0000 mg | ORAL_CAPSULE | Freq: Once | ORAL | Status: AC
  Administered 2018-06-28: 100 mg via ORAL
  Filled 2018-06-28: qty 1

## 2018-06-28 MED ORDER — ALBUTEROL SULFATE (2.5 MG/3ML) 0.083% IN NEBU: 2.5000 mg | INHALATION_SOLUTION | Freq: Four times a day (QID) | RESPIRATORY_TRACT | 12 refills | Status: DC | PRN

## 2018-06-28 MED ORDER — GUAIFENESIN-CODEINE 100-10 MG/5ML PO SYRP: 5.0000 mL | ORAL_SOLUTION | Freq: Three times a day (TID) | ORAL | 0 refills | Status: AC | PRN

## 2018-06-28 MED ORDER — BENZONATATE 100 MG PO CAPS: 100.0000 mg | ORAL_CAPSULE | Freq: Three times a day (TID) | ORAL | 0 refills | Status: DC | PRN

## 2018-06-28 NOTE — ED Notes (Signed)
See triage note  States she states she did not feel well on Friday  But developed body aches and subjective fever yesterday  Low grade fever noted on arrival with cough

## 2018-06-28 NOTE — ED Triage Notes (Signed)
Patient works here on [employer], states she started coughing last Friday and symptoms have worsened over the weekend.  Complaining of body aches, fever, cough, and HA.  Describes cough as non-productive.  Hoarse voice.  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

## 2018-06-28 NOTE — ED Provider Notes (Signed)
[facility] Emergency Department Provider Note  ____________________________________________  Time seen: Approximately 10:02 AM  I have reviewed the triage vital signs and the nursing notes.   HISTORY  Chief Complaint Generalized Body Aches; Cough; Fever; and Headache    HPI Amanda Weeks is a 43 y.o. female that presents to the emergency department for evaluation of fever, body aches, nasal congestion, and cough. She works in the general surgery. She has been taking flonase, tylenol and motrin.  Patient has an albuterol inhaler at home and a nebulizer machine but is out of nebulizer solution.  Daughter now has similar symptoms.  No nausea, vomiting, diarrhea.    Past Medical History:  Diagnosis Date  . Migraines     There are no active problems to display for this patient.   Past Surgical History:  Procedure Laterality Date  . CESAREAN SECTION      Prior to Admission medications   Medication Sig Start Date End Date Taking? Authorizing Provider  acetaminophen (TYLENOL) 500 MG tablet Take 1 tablet (500 mg total) by mouth every 6 (six) hours as needed. 06/28/18   Enid Derry, PA-C  albuterol (PROVENTIL) (2.5 MG/3ML) 0.083% nebulizer solution Take 3 mLs (2.5 mg total) by nebulization every 6 (six) hours as needed for wheezing or shortness of breath. 06/28/18   Enid Derry, PA-C  azithromycin (ZITHROMAX) 250 MG tablet Take 2 tabs PO x 1 dose, then 1 tab PO QD x 4 days 11/30/17   Bing Neighbors, FNP  benzonatate (TESSALON PERLES) 100 MG capsule Take 1 capsule (100 mg total) by mouth 3 (three) times daily as needed for cough. 06/28/18 06/28/19  Enid Derry, PA-C  guaiFENesin-codeine (ROBITUSSIN AC) 100-10 MG/5ML syrup Take 5 mLs by mouth 3 (three) times daily as needed for up to 2 days for cough. 06/28/18 06/30/18  Enid Derry, PA-C  ibuprofen (ADVIL,MOTRIN) 600 MG tablet Take 1 tablet (600 mg total) by mouth every 6 (six) hours as needed. 06/28/18    Enid Derry, PA-C  levocetirizine (XYZAL) 5 MG tablet Take 1 tablet (5 mg total) by mouth every evening. 11/27/17   Bing Neighbors, FNP    Allergies Patient has no known allergies.  No family history on file.  Social History Social History   Tobacco Use  . Smoking status: Never Smoker  . Smokeless tobacco: Never Used  Substance Use Topics  . Alcohol use: Yes    Alcohol/week: 0.0 standard drinks    Comment: Occasional  . Drug use: Never     Review of Systems  Constitutional: Positive for fever. Eyes: No visual changes. No discharge. ENT: Positive for congestion and rhinorrhea. Cardiovascular: No chest pain. Respiratory: Positive for cough. No SOB. Gastrointestinal: No abdominal pain.  No nausea, no vomiting.  No diarrhea.  No constipation. Musculoskeletal: Positive for body aches. Skin: Negative for rash, abrasions, lacerations, ecchymosis. Neurological: Positive for headache.   ____________________________________________   PHYSICAL EXAM:  VITAL SIGNS: ED Triage Vitals  Enc Vitals Group     BP 06/28/18 0823 (!) 107/52     Pulse Rate 06/28/18 0823 (!) 104     Resp 06/28/18 0823 20     Temp 06/28/18 0823 97.9 F (36.6 C)     Temp Source 06/28/18 0823 Oral     SpO2 06/28/18 0823 99 %     Weight 06/28/18 0817 192 lb (87.1 kg)     Height 06/28/18 0817 5\' 2"  (1.575 m)     Head Circumference --  Peak Flow --      Pain Score 06/28/18 0824 6     Pain Loc --      Pain Edu? --      Excl. in GC? --      Constitutional: Alert and oriented. Well appearing and in no acute distress. Eyes: Conjunctivae are normal. PERRL. EOMI. No discharge. Head: Atraumatic. ENT: No frontal and maxillary sinus tenderness.      Ears: Tympanic membranes pearly gray with good landmarks. No discharge.      Nose: Mild congestion/rhinnorhea.      Mouth/Throat: Mucous membranes are moist. Oropharynx non-erythematous. Tonsils not enlarged. No exudates. Uvula midline. Neck: No  stridor.   Hematological/Lymphatic/Immunilogical: No cervical lymphadenopathy. Cardiovascular: Normal rate, regular rhythm.  Good peripheral circulation. Respiratory: Normal respiratory effort without tachypnea or retractions. Lungs CTAB. Good air entry to the bases with no decreased or absent breath sounds. Gastrointestinal: Bowel sounds 4 quadrants. Soft and nontender to palpation. No guarding or rigidity. No palpable masses. No distention. Musculoskeletal: Full range of motion to all extremities. No gross deformities appreciated. Neurologic:  Normal speech and language. No gross focal neurologic deficits are appreciated.  Skin:  Skin is warm, dry and intact. No rash noted. Psychiatric: Mood and affect are normal. Speech and behavior are normal. Patient exhibits appropriate insight and judgement.   ____________________________________________   LABS (all labs ordered are listed, but only abnormal results are displayed)  Labs Reviewed - No data to display ____________________________________________  EKG   ____________________________________________  RADIOLOGY Lexine BatonI, Ayanni Tun, personally viewed and evaluated these images (plain radiographs) as part of my medical decision making, as well as reviewing the written report by the radiologist.  Dg Chest 2 View  Result Date: 06/28/2018 CLINICAL DATA:  Flu-like symptoms.  Cough and wheezing EXAM: CHEST - 2 VIEW COMPARISON:  06/26/2012 FINDINGS: Normal heart size and mediastinal contours. No acute infiltrate or edema. No effusion or pneumothorax. No acute osseous findings. IMPRESSION: Negative chest. Electronically Signed   By: Marnee SpringJonathon  Watts M.D.   On: 06/28/2018 11:41    ____________________________________________    PROCEDURES  Procedure(s) performed:    Procedures    Medications  ipratropium-albuterol (DUONEB) 0.5-2.5 (3) MG/3ML nebulizer solution 3 mL (3 mLs Nebulization Given 06/28/18 1052)  benzonatate (TESSALON)  capsule 100 mg (100 mg Oral Given 06/28/18 1052)     ____________________________________________   INITIAL IMPRESSION / ASSESSMENT AND PLAN / ED COURSE  Pertinent labs & imaging results that were available during my care of the patient were reviewed by me and considered in my medical decision making (see chart for details).  Review of the [place] CSRS was performed in accordance of the [facility] prior to dispensing any controlled drugs.   Patient's diagnosis is consistent with influenza. Vital signs and exam are reassuring.  Chest x-ray is negative for acute cardiopulmonary processes.  DuoNeb was given and patient felt better after treatment.  Symptoms are consistent with influenza and patient has several sick contacts.  Patient will increase her fluid intake orally.  Patient should alternate tylenol and ibuprofen for fever. Patient feels comfortable going home. Patient will be discharged home with prescriptions for robitussin, tessalon perles, albuterol inhaler, tylenol, motrin. Patient is to follow up with PCP as needed or otherwise directed. Patient is given ED precautions to return to the ED for any worsening or new symptoms.     ____________________________________________  FINAL CLINICAL IMPRESSION(S) / ED DIAGNOSES  Final diagnoses:  Influenza      NEW MEDICATIONS STARTED DURING  THIS VISIT:  ED Discharge Orders         Ordered    guaiFENesin-codeine (ROBITUSSIN AC) 100-10 MG/5ML syrup  3 times daily PRN     06/28/18 1148    benzonatate (TESSALON PERLES) 100 MG capsule  3 times daily PRN     06/28/18 1148    albuterol (PROVENTIL) (2.5 MG/3ML) 0.083% nebulizer solution  Every 6 hours PRN     06/28/18 1148    ibuprofen (ADVIL,MOTRIN) 600 MG tablet  Every 6 hours PRN     06/28/18 1148    acetaminophen (TYLENOL) 500 MG tablet  Every 6 hours PRN     06/28/18 1148              This chart was dictated using voice recognition software/Dragon. Despite best efforts to proofread,  errors can occur which can change the meaning. Any change was purely unintentional.    Enid Derry, PA-C 06/28/18 1649    Willy Eddy, MD 06/29/18 [phone]

## 2018-06-28 NOTE — ED Notes (Signed)
VS were taken in seated position.  BP 104/47 and Pulse 105 standing.

## 2018-06-28 NOTE — ED Notes (Signed)
First Nurse Note: Patient complaining of flu like symptoms.  States she works here on [employer] and they had 8 flu patients last week.  Wearing a face mask.

## 2019-05-11 ENCOUNTER — Encounter: Payer: Self-pay | Admitting: Emergency Medicine

## 2019-05-11 ENCOUNTER — Inpatient Hospital Stay
Admission: EM | Admit: 2019-05-11 | Discharge: 2019-05-17 | DRG: 177 | Disposition: A | Discharge status: Home / Self Care | Payer: HRSA Program | Attending: Internal Medicine | Admitting: Internal Medicine

## 2019-05-11 ENCOUNTER — Other Ambulatory Visit: Payer: Self-pay

## 2019-05-11 ENCOUNTER — Emergency Department: Payer: HRSA Program

## 2019-05-11 DIAGNOSIS — G43009 Migraine without aura, not intractable, without status migrainosus: Secondary | ICD-10-CM

## 2019-05-11 DIAGNOSIS — J1289 Other viral pneumonia: Secondary | ICD-10-CM | POA: Diagnosis present

## 2019-05-11 DIAGNOSIS — J9601 Acute respiratory failure with hypoxia: Secondary | ICD-10-CM

## 2019-05-11 DIAGNOSIS — G43909 Migraine, unspecified, not intractable, without status migrainosus: Secondary | ICD-10-CM | POA: Diagnosis present

## 2019-05-11 DIAGNOSIS — J1282 Pneumonia due to coronavirus disease 2019: Secondary | ICD-10-CM | POA: Diagnosis present

## 2019-05-11 DIAGNOSIS — U071 COVID-19: Secondary | ICD-10-CM | POA: Diagnosis present

## 2019-05-11 DIAGNOSIS — R0602 Shortness of breath: Secondary | ICD-10-CM

## 2019-05-11 DIAGNOSIS — R0902 Hypoxemia: Secondary | ICD-10-CM

## 2019-05-11 LAB — CBC WITH DIFFERENTIAL/PLATELET
Abs Immature Granulocytes: 0.02 10*3/uL (ref 0.00–0.07)
Basophils Absolute: 0 10*3/uL (ref 0.0–0.1)
Basophils Relative: 0 %
Eosinophils Absolute: 0 10*3/uL (ref 0.0–0.5)
Eosinophils Relative: 0 %
HCT: 36.2 % (ref 36.0–46.0)
Hemoglobin: 11.7 g/dL — ABNORMAL LOW (ref 12.0–15.0)
Immature Granulocytes: 0 %
Lymphocytes Relative: 15 %
Lymphs Abs: 0.9 10*3/uL (ref 0.7–4.0)
MCH: 25.6 pg — ABNORMAL LOW (ref 26.0–34.0)
MCHC: 32.3 g/dL (ref 30.0–36.0)
MCV: 79.2 fL — ABNORMAL LOW (ref 80.0–100.0)
Monocytes Absolute: 0.3 10*3/uL (ref 0.1–1.0)
Monocytes Relative: 4 %
Neutro Abs: 4.8 10*3/uL (ref 1.7–7.7)
Neutrophils Relative %: 81 %
Platelets: 201 10*3/uL (ref 150–400)
RBC: 4.57 MIL/uL (ref 3.87–5.11)
RDW: 15.9 % — ABNORMAL HIGH (ref 11.5–15.5)
WBC: 6 10*3/uL (ref 4.0–10.5)
nRBC: 0 % (ref 0.0–0.2)

## 2019-05-11 LAB — COMPREHENSIVE METABOLIC PANEL
ALT: 26 U/L (ref 0–44)
AST: 33 U/L (ref 15–41)
Albumin: 3.6 g/dL (ref 3.5–5.0)
Alkaline Phosphatase: 53 U/L (ref 38–126)
Anion gap: 14 (ref 5–15)
BUN: 10 mg/dL (ref 6–20)
CO2: 22 mmol/L (ref 22–32)
Calcium: 8.3 mg/dL — ABNORMAL LOW (ref 8.9–10.3)
Chloride: 101 mmol/L (ref 98–111)
Creatinine, Ser: 0.57 mg/dL (ref 0.44–1.00)
GFR calc Af Amer: >60 mL/min (ref >60)
GFR calc non Af Amer: >60 mL/min (ref >60)
Glucose, Bld: 137 mg/dL — ABNORMAL HIGH (ref 70–99)
Potassium: 3 mmol/L — ABNORMAL LOW (ref 3.5–5.1)
Sodium: 137 mmol/L (ref 135–145)
Total Bilirubin: 0.6 mg/dL (ref 0.3–1.2)
Total Protein: 7.6 g/dL (ref 6.5–8.1)

## 2019-05-11 LAB — PROCALCITONIN: Procalcitonin: <0.1 ng/mL

## 2019-05-11 LAB — LACTIC ACID, PLASMA: Lactic Acid, Venous: 2.8 mmol/L (ref 0.5–1.9)

## 2019-05-11 LAB — ABO/RH: ABO/RH(D): A NEG

## 2019-05-11 MED ORDER — SODIUM CHLORIDE 0.9 % IV SOLN: INTRAVENOUS | Status: DC

## 2019-05-11 MED ORDER — ONDANSETRON HCL 4 MG PO TABS: 4.0000 mg | ORAL_TABLET | Freq: Four times a day (QID) | ORAL | Status: DC | PRN

## 2019-05-11 MED ORDER — ALBUTEROL SULFATE HFA 108 (90 BASE) MCG/ACT IN AERS
2.0000 | INHALATION_SPRAY | Freq: Four times a day (QID) | RESPIRATORY_TRACT | Status: DC
  Filled 2019-05-11: qty 6.7

## 2019-05-11 MED ORDER — SODIUM CHLORIDE 0.9 % IV SOLN
100.0000 mg | Freq: Every day | INTRAVENOUS | Status: AC
  Administered 2019-05-12 – 2019-05-15 (×4): 100 mg via INTRAVENOUS
  Filled 2019-05-11 (×4): qty 100

## 2019-05-11 MED ORDER — POLYETHYLENE GLYCOL 3350 17 G PO PACK: 17.0000 g | PACK | Freq: Every day | ORAL | Status: DC | PRN

## 2019-05-11 MED ORDER — ZINC SULFATE 220 (50 ZN) MG PO CAPS
220.0000 mg | ORAL_CAPSULE | Freq: Every day | ORAL | Status: DC
  Administered 2019-05-11 – 2019-05-16 (×6): 220 mg via ORAL
  Filled 2019-05-11 (×8): qty 1

## 2019-05-11 MED ORDER — GUAIFENESIN-DM 100-10 MG/5ML PO SYRP
10.0000 mL | ORAL_SOLUTION | ORAL | Status: DC | PRN
  Administered 2019-05-12 – 2019-05-15 (×13): 10 mL via ORAL
  Filled 2019-05-11 (×16): qty 10

## 2019-05-11 MED ORDER — BISACODYL 5 MG PO TBEC: 5.0000 mg | DELAYED_RELEASE_TABLET | Freq: Every day | ORAL | Status: DC | PRN

## 2019-05-11 MED ORDER — HYDROCOD POLST-CPM POLST ER 10-8 MG/5ML PO SUER
5.0000 mL | Freq: Two times a day (BID) | ORAL | Status: DC | PRN
  Administered 2019-05-11 – 2019-05-15 (×7): 5 mL via ORAL
  Filled 2019-05-11 (×7): qty 5

## 2019-05-11 MED ORDER — SODIUM CHLORIDE 0.9% FLUSH
3.0000 mL | Freq: Two times a day (BID) | INTRAVENOUS | Status: DC
  Administered 2019-05-12 – 2019-05-17 (×9): 3 mL via INTRAVENOUS

## 2019-05-11 MED ORDER — ACETAMINOPHEN 325 MG PO TABS
650.0000 mg | ORAL_TABLET | Freq: Four times a day (QID) | ORAL | Status: DC | PRN
  Administered 2019-05-13 – 2019-05-17 (×8): 650 mg via ORAL
  Filled 2019-05-11 (×8): qty 2

## 2019-05-11 MED ORDER — ALBUTEROL SULFATE (2.5 MG/3ML) 0.083% IN NEBU
5.0000 mg | INHALATION_SOLUTION | Freq: Once | RESPIRATORY_TRACT | Status: AC
  Administered 2019-05-11: 17:00:00 5 mg via RESPIRATORY_TRACT
  Filled 2019-05-11: qty 6

## 2019-05-11 MED ORDER — MAGNESIUM CITRATE PO SOLN: 1.0000 | Freq: Once | ORAL | Status: DC | PRN

## 2019-05-11 MED ORDER — ASCORBIC ACID 500 MG PO TABS
500.0000 mg | ORAL_TABLET | Freq: Every day | ORAL | Status: DC
  Administered 2019-05-11 – 2019-05-16 (×6): 500 mg via ORAL
  Filled 2019-05-11 (×7): qty 1

## 2019-05-11 MED ORDER — ACETAMINOPHEN 325 MG PO TABS
650.0000 mg | ORAL_TABLET | Freq: Once | ORAL | Status: AC
  Administered 2019-05-11: 17:00:00 650 mg via ORAL
  Filled 2019-05-11: qty 2

## 2019-05-11 MED ORDER — BUTALBITAL-APAP-CAFFEINE 50-325-40 MG PO TABS
1.0000 | ORAL_TABLET | Freq: Four times a day (QID) | ORAL | Status: DC | PRN
  Administered 2019-05-11: 19:00:00 1 via ORAL
  Filled 2019-05-11: qty 1

## 2019-05-11 MED ORDER — ALBUTEROL SULFATE HFA 108 (90 BASE) MCG/ACT IN AERS
2.0000 | INHALATION_SPRAY | Freq: Four times a day (QID) | RESPIRATORY_TRACT | Status: DC
  Administered 2019-05-11 – 2019-05-15 (×13): 2 via RESPIRATORY_TRACT
  Filled 2019-05-11: qty 6.7

## 2019-05-11 MED ORDER — TRAMADOL HCL 50 MG PO TABS
50.0000 mg | ORAL_TABLET | Freq: Four times a day (QID) | ORAL | Status: DC | PRN
  Administered 2019-05-11 – 2019-05-15 (×5): 50 mg via ORAL
  Filled 2019-05-11 (×5): qty 1

## 2019-05-11 MED ORDER — SODIUM CHLORIDE 0.9 % IV BOLUS
1000.0000 mL | Freq: Once | INTRAVENOUS | Status: AC
  Administered 2019-05-11: 18:00:00 1000 mL via INTRAVENOUS

## 2019-05-11 MED ORDER — ONDANSETRON HCL 4 MG/2ML IJ SOLN: 4.0000 mg | Freq: Four times a day (QID) | INTRAMUSCULAR | Status: DC | PRN

## 2019-05-11 MED ORDER — SODIUM CHLORIDE 0.9 % IV SOLN
200.0000 mg | Freq: Once | INTRAVENOUS | Status: AC
  Administered 2019-05-11: 19:00:00 200 mg via INTRAVENOUS
  Filled 2019-05-11: qty 200

## 2019-05-11 MED ORDER — DEXAMETHASONE SODIUM PHOSPHATE 10 MG/ML IJ SOLN
6.0000 mg | INTRAMUSCULAR | Status: DC
  Administered 2019-05-12 – 2019-05-17 (×6): 6 mg via INTRAVENOUS
  Filled 2019-05-11: qty 0.6
  Filled 2019-05-11: qty 1
  Filled 2019-05-11 (×4): qty 0.6

## 2019-05-11 MED ORDER — ENOXAPARIN SODIUM 40 MG/0.4ML ~~LOC~~ SOLN
40.0000 mg | SUBCUTANEOUS | Status: DC
  Administered 2019-05-11 – 2019-05-16 (×6): 40 mg via SUBCUTANEOUS
  Filled 2019-05-11 (×6): qty 0.4

## 2019-05-11 MED ORDER — METHYLPREDNISOLONE SODIUM SUCC 125 MG IJ SOLR
125.0000 mg | Freq: Once | INTRAMUSCULAR | Status: AC
  Administered 2019-05-11: 125 mg via INTRAMUSCULAR
  Filled 2019-05-11: qty 2

## 2019-05-11 NOTE — ED Triage Notes (Signed)
Says has covid.  Says continues to run fevers, feels cold and pulse ox is less than 90

## 2019-05-11 NOTE — ED Notes (Signed)
Pt ambulated and HR 130, sats decreased to 88% RA.  Labored with exertion.

## 2019-05-11 NOTE — Consult Note (Addendum)
Remdesivir - Pharmacy Brief Note   O:  ALT: pending  CXR: Bilateral pulmonary infiltrates, particularly in the bases, consistent with the reported history of COVID-19 positive status and shortness of breath. SpO2: 88 % on Room air (lowest)  COVID + 05/07/2019   A/P:  Remdesivir 200 mg IVPB once followed by 100 mg IVPB daily x 4 days.   Kristeen Miss, PharmD Clinical Pharmacist  05/11/2019 6:05 PM

## 2019-05-11 NOTE — ED Notes (Signed)
Pt here for covid sx of SHOB, cough, body aches and not feeling well.  Sx of covid started last Thursday and pt was tested Saturday with positive results.

## 2019-05-11 NOTE — H&P (Signed)
History and Physical    Amanda Weeks [id number] DOB: April 08, 1976 DOA: 05/11/2019  PCP: Patient, No Pcp Per Patient coming from: Home  I have personally briefly reviewed patient's old medical records in [place]  Chief Complaint: Fever, shortness of breath  HPI: Amanda Weeks is a 43 y.o. female with medical history significant of migraines presented to the ED this afternoon for worsening shortness of breath and fevers.  She reports onset of symptoms last Thursday, 5 days ago, including fever, myalgias, nasal congestion, cough and shortness of breath.  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  Reports that she was tested for Covid on Friday and received positive result on Saturday.  She been managing her symptoms at home with Tylenol and rest however she continued get worse.  She was watching her O2 sat at home and noticed a drop into the 80s, which prompted her coming to the ED today.  She denies abdominal pain, nausea vomiting or diarrhea, or chest pain.  During my encounter patient's heart rate in the 110's on the monitor, patient reports this is her normal heart rate at baseline.  Her only other complaint during my encounter was headache despite Tylenol given an hour ago.   ED Course: Initially afebrile, but spiked fever to 102.8, tachycardic, tachypneic, hypoxic at 88% on room air improved on 2 L/min oxygen.  CMP still pending at this time.  Other labs notable for lactic acid 2.8, no leukocytosis, hemoglobin 11.7.  Chest x-ray showed bilateral pulmonary infiltrates most significant at the bases.  She was treated with Tylenol, albuterol neb, Solu-Medrol 125 mg, and 1 L fluid bolus.  Admitted for management of acute hypoxic respiratory failure secondary to pneumonia due to COVID-19.  Review of Systems: As per HPI otherwise 10 point review of systems negative.    Past Medical History:  Diagnosis Date  . Migraines     Past  Surgical History:  Procedure Laterality Date  . CESAREAN SECTION       reports that she has never smoked. She has never used smokeless tobacco. She reports current alcohol use. She reports that she does not use drugs.  No Known Allergies  FAMILY HISTORY - DIABETES on Mother's side of family  Prior to Admission medications   Medication Sig Start Date End Date Taking? Authorizing Provider  acetaminophen (TYLENOL) 500 MG tablet Take 1 tablet (500 mg total) by mouth every 6 (six) hours as needed. 06/28/18   Laban Emperor, PA-C  albuterol (PROVENTIL) (2.5 MG/3ML) 0.083% nebulizer solution Take 3 mLs (2.5 mg total) by nebulization every 6 (six) hours as needed for wheezing or shortness of breath. 06/28/18   Laban Emperor, PA-C  azithromycin (ZITHROMAX) 250 MG tablet Take 2 tabs PO x 1 dose, then 1 tab PO QD x 4 days 11/30/17   Scot Jun, FNP  benzonatate (TESSALON PERLES) 100 MG capsule Take 1 capsule (100 mg total) by mouth 3 (three) times daily as needed for cough. 06/28/18 06/28/19  Laban Emperor, PA-C  ibuprofen (ADVIL,MOTRIN) 600 MG tablet Take 1 tablet (600 mg total) by mouth every 6 (six) hours as needed. 06/28/18   Laban Emperor, PA-C  levocetirizine (XYZAL) 5 MG tablet Take 1 tablet (5 mg total) by mouth every evening. 11/27/17   Scot Jun, FNP    Physical Exam: Vitals:   05/11/19 1722 05/11/19 1723 05/11/19 1741 05/11/19 1745  BP: (!) 118/40  117/64   Pulse: Marland Kitchen)  130  (!) 119 (!) 117  Resp: (!) 28  (!) 23 (!) 22  Temp: (!) 102.8 F (39.3 C)     TempSrc: Oral     SpO2: 91% (!) 88% 93% 95%  Weight:      Height:        Constitutional: NAD, calm, comfortable Vitals:   05/11/19 1722 05/11/19 1723 05/11/19 1741 05/11/19 1745  BP: (!) 118/40  117/64   Pulse: (!) 130  (!) 119 (!) 117  Resp: (!) 28  (!) 23 (!) 22  Temp: (!) 102.8 F (39.3 C)     TempSrc: Oral     SpO2: 91% (!) 88% 93% 95%  Weight:      Height:       Eyes: lids and conjunctivae normal, EOMI ENMT:  Mucous membranes are moist. Posterior pharynx clear of any exudate or lesions.Normal dentition.  Respiratory: decreased breath sounds but clear, no wheezing or rhonchi, mildly increased work of breathing  Cardiovascular: Regular rate and rhythm, no murmurs / rubs / gallops. No extremity edema. 2+ pedal pulses.  Abdomen: soft, non-tender, non-distended, bowel sounds present  Musculoskeletal: no clubbing / cyanosis. No joint deformity upper and lower extremities. Normal muscle tone.  Skin: warm, diaphoretic Neurologic: CN 2-12 grossly intact.  Normal speech. Psychiatric: Normal judgment and insight. Alert and oriented x 3. Normal mood.    Labs on Admission: I have personally reviewed following labs and imaging studies  CBC: Recent Labs  Lab 05/11/19 1737  WBC 6.0  NEUTROABS 4.8  HGB 11.7*  HCT 36.2  MCV 79.2*  PLT 201   Basic Metabolic Panel: No results for input(s): NA, K, CL, CO2, GLUCOSE, BUN, CREATININE, CALCIUM, MG, PHOS in the last 168 hours. GFR: CrCl cannot be calculated (Patient's most recent lab result is older than the maximum 21 days allowed.). Liver Function Tests: No results for input(s): AST, ALT, ALKPHOS, BILITOT, PROT, ALBUMIN in the last 168 hours. No results for input(s): LIPASE, AMYLASE in the last 168 hours. No results for input(s): AMMONIA in the last 168 hours. Coagulation Profile: No results for input(s): INR, PROTIME in the last 168 hours. Cardiac Enzymes: No results for input(s): CKTOTAL, CKMB, CKMBINDEX, TROPONINI in the last 168 hours. BNP (last 3 results) No results for input(s): PROBNP in the last 8760 hours. HbA1C: No results for input(s): HGBA1C in the last 72 hours. CBG: No results for input(s): GLUCAP in the last 168 hours. Lipid Profile: No results for input(s): CHOL, HDL, LDLCALC, TRIG, CHOLHDL, LDLDIRECT in the last 72 hours. Thyroid Function Tests: No results for input(s): TSH, T4TOTAL, FREET4, T3FREE, THYROIDAB in the last 72  hours. Anemia Panel: No results for input(s): VITAMINB12, FOLATE, FERRITIN, TIBC, IRON, RETICCTPCT in the last 72 hours. Urine analysis: No results found for: COLORURINE, APPEARANCEUR, LABSPEC, PHURINE, GLUCOSEU, HGBUR, BILIRUBINUR, KETONESUR, PROTEINUR, UROBILINOGEN, NITRITE, LEUKOCYTESUR  Radiological Exams on Admission: DG Chest 2 View  Result Date: 05/11/2019 CLINICAL DATA:  COVID-19 positive.  Shortness of breath. EXAM: CHEST - 2 VIEW COMPARISON:  June 28, 2018 FINDINGS: Bilateral pulmonary infiltrates, particularly in the bases, are consistent with the patient's COVID-19 status. No pneumothorax. The cardiomediastinal silhouette is normal. No other acute abnormalities. IMPRESSION: Bilateral pulmonary infiltrates, particularly in the bases, consistent with the reported history of COVID-19 positive status and shortness of breath. Electronically Signed   By: Gerome Sam III M.D   On: 05/11/2019 15:49    EKG: Independently reviewed.   Assessment/Plan Principal Problem:   Pneumonia due to COVID-19  virus Active Problems:   Acute respiratory failure with hypoxia (HCC)   Migraine headache   Acute respiratory failure with hypoxia secondary to COVID-19 pneumonia Patient presented with fever, tachypnea, tachycardia, hypoxia and worsening shortness of breath. Chest x-ray with multifocal infiltrates, consistent with COVID-19 pneumonia.  Covid test was done in the outpatient setting on Friday, returned positive on Saturday, per patient. -Follow-up pending labs including CMP and inflammatory markers -Remdesivir per pharmacy protocol -Decadron IV -Vitamin C and zinc -Supplemental oxygen to maintain O2 sat > 92% -Antitussives, antipyretics, inhaler -Maintain airborne and contact precautions -Monitor inflammatory markers daily   History of migraine headaches -with active headache on admission.  Not on prescription medications at home.  Not relieved by Tylenol previously given. -Fioricet  as needed   DVT prophylaxis: Lovenox Code Status: Full Family Communication: None at bedside Disposition Plan: Pending clinical improvement and weaning of oxygen.  Expect DC home in 2 to 3 days. Consults called: None Admission status: Inpatient    Patient will require inpatient status due to severity of illness as outlined above, requiring supplemental oxygen, IV medications and close monitoring and surveillance in the hospital setting.  I certify that, at the point of admission, it is my clinical judgment that the patient will require inpatient hospital care spanning beyond 2 midnights from the point of admission.    Pennie BanterKelly A Meriel Kelliher, DO [facility] Pager [phone]  If 7PM-7AM, please contact night-coverage [email or web address] Password [email or web address]  05/11/2019, 6:19 PM

## 2019-05-11 NOTE — ED Provider Notes (Signed)
[facility] Emergency Department Provider Note  ____________________________________________  Time seen: Approximately 4:11 PM  I have reviewed the triage vital signs and the nursing notes.   HISTORY  Chief Complaint Fever and Shortness of Breath    HPI Amanda Weeks is a 43 y.o. female who presents the emergency department complaining of fever, body aches, nasal congestion, cough, shortness of breath.  Patient reports that she started with nasal congestion 5 days ago, was tested for COVID-19 and was positive.  Patient works in Teacher, music.  Patient reports that she has had increased shortness of breath, including shortness of breath at rest.   Patient denies any headache, neck pain or stiffness, chest pain, domino pain, nausea vomiting, diarrhea or constipation.  No chronic medical problems.  No chronic medications.        Past Medical History:  Diagnosis Date  . Migraines     Patient Active Problem List   Diagnosis Date Noted  . Pneumonia due to COVID-19 virus 05/11/2019  . Acute respiratory failure with hypoxia (HCC) 05/11/2019  . Migraine headache 05/11/2019    Past Surgical History:  Procedure Laterality Date  . CESAREAN SECTION      Prior to Admission medications   Medication Sig Start Date End Date Taking? Authorizing Provider  acetaminophen (TYLENOL) 500 MG tablet Take 1 tablet (500 mg total) by mouth every 6 (six) hours as needed. 06/28/18  Yes Enid Derry, PA-C  cholecalciferol (VITAMIN D3) 25 MCG (1000 UT) tablet Take 1,000 Units by mouth daily.   Yes [provider]  vitamin C (ASCORBIC ACID) 250 MG tablet Take 250 mg by mouth daily.   Yes [provider]  zinc gluconate 50 MG tablet Take 50 mg by mouth daily.   Yes [provider]    Allergies Patient has no known allergies.  No family history on file.  Social History Social History   Tobacco Use  . Smoking status: Never Smoker  . Smokeless  tobacco: Never Used  Substance Use Topics  . Alcohol use: Yes    Alcohol/week: 0.0 standard drinks    Comment: Occasional  . Drug use: Never     Review of Systems  Constitutional: Positive fever/chills.  Positive for body aches Eyes: No visual changes. No discharge ENT: Positive for nasal congestion Cardiovascular: no chest pain. Respiratory: Positive for cough and shortness of breath Gastrointestinal: No abdominal pain.  No nausea, no vomiting.  No diarrhea.  No constipation. Musculoskeletal: Negative for musculoskeletal pain. Skin: Negative for rash, abrasions, lacerations, ecchymosis. Neurological: Negative for headaches, focal weakness or numbness. 10-point ROS otherwise negative.  ____________________________________________   PHYSICAL EXAM:  VITAL SIGNS: ED Triage Vitals  Enc Vitals Group     BP 05/11/19 1520 100/85     Pulse Rate 05/11/19 1520 (!) 114     Resp 05/11/19 1520 16     Temp 05/11/19 1520 98.5 F (36.9 C)     Temp Source 05/11/19 1520 Oral     SpO2 05/11/19 1520 94 %     Weight 05/11/19 1521 190 lb (86.2 kg)     Height 05/11/19 1521  (1.575 m)     Head Circumference --      Peak Flow --      Pain Score 05/11/19 1520 8     Pain Loc --      Pain Edu? --      Excl. in GC? --      Constitutional: Alert and oriented. Well appearing  and in no acute distress. Eyes: Conjunctivae are normal. PERRL. EOMI. Head: Atraumatic. ENT:      Ears:       Nose: No congestion/rhinnorhea.      Mouth/Throat: Mucous membranes are moist.  Oropharynx is nonerythematous and nonedematous.  Uvula is midline. Neck: No stridor.  No cervical spine tenderness to palpation. Hematological/Lymphatic/Immunilogical: No cervical lymphadenopathy. Cardiovascular: Normal rate, regular rhythm. Normal S1 and S2.  Good peripheral circulation. Respiratory: Mildly increased respiratory effort withtachypnea but no retractions. Lungs CTAB with no wheezing, rales or rhonchi.Peri [place]. Good air  entry to the bases with no decreased or absent breath sounds. Musculoskeletal: Full range of motion to all extremities. No gross deformities appreciated. Neurologic:  Normal speech and language. No gross focal neurologic deficits are appreciated.  Skin:  Skin is warm, dry and intact. No rash noted. Psychiatric: Mood and affect are normal. Speech and behavior are normal. Patient exhibits appropriate insight and judgement.   ____________________________________________   LABS (all labs ordered are listed, but only abnormal results are displayed)  Labs Reviewed  LACTIC ACID, PLASMA - Abnormal; Notable for the following components:      Result Value   Lactic Acid, Venous 2.8 (*)    All other components within normal limits  CBC WITH DIFFERENTIAL/PLATELET - Abnormal; Notable for the following components:   Hemoglobin 11.7 (*)    MCV 79.2 (*)    MCH 25.6 (*)    RDW 15.9 (*)    All other components within normal limits  PROCALCITONIN  COMPREHENSIVE METABOLIC PANEL  LACTIC ACID, PLASMA  HIV ANTIBODY (ROUTINE TESTING W REFLEX)  CBC  CREATININE, SERUM  CBC WITH DIFFERENTIAL/PLATELET  COMPREHENSIVE METABOLIC PANEL  C-REACTIVE PROTEIN  FIBRIN DERIVATIVES D-DIMER ([facility] ONLY)  FERRITIN  MAGNESIUM  PHOSPHORUS  ABO/RH   ____________________________________________  EKG   ____________________________________________  RADIOLOGY I personally viewed and evaluated these images as part of my medical decision making, as well as reviewing the written report by the radiologist.  I concur with bilateral pulmonary infiltrates consistent with COVID-19.  No consolidation concerning for pneumonia.  DG Chest 2 View  Result Date: 05/11/2019 CLINICAL DATA:  COVID-19 positive.  Shortness of breath. EXAM: CHEST - 2 VIEW COMPARISON:  June 28, 2018 FINDINGS: Bilateral pulmonary infiltrates, particularly in the bases, are consistent with the patient's COVID-19 status. No pneumothorax. The  cardiomediastinal silhouette is normal. No other acute abnormalities. IMPRESSION: Bilateral pulmonary infiltrates, particularly in the bases, consistent with the reported history of COVID-19 positive status and shortness of breath. Electronically Signed   By: Gerome Samavid  Williams III M.D   On: 05/11/2019 15:49    ____________________________________________    PROCEDURES  Procedure(s) performed:    Procedures    Medications  enoxaparin (LOVENOX) injection 40 mg (has no administration in time range)  sodium chloride flush (NS) 0.9 % injection 3 mL (has no administration in time range)  remdesivir 200 mg in sodium chloride 0.9% 250 mL IVPB (has no administration in time range)    Followed by  remdesivir 100 mg in sodium chloride 0.9 % 100 mL IVPB (has no administration in time range)  dexamethasone (DECADRON) injection 6 mg (has no administration in time range)  guaiFENesin-dextromethorphan (ROBITUSSIN DM) 100-10 MG/5ML syrup 10 mL (has no administration in time range)  chlorpheniramine-HYDROcodone (TUSSIONEX) 10-8 MG/5ML suspension 5 mL (has no administration in time range)  ascorbic acid (VITAMIN C) tablet 500 mg (has no administration in time range)  zinc sulfate capsule 220 mg (has no administration in time range)  acetaminophen (TYLENOL) tablet 650 mg (has no administration in time range)  traMADol (ULTRAM) tablet 50 mg (has no administration in time range)  polyethylene glycol (MIRALAX / GLYCOLAX) packet 17 g (has no administration in time range)  bisacodyl (DULCOLAX) EC tablet 5 mg (has no administration in time range)  magnesium citrate solution 1 Bottle (has no administration in time range)  ondansetron (ZOFRAN) tablet 4 mg (has no administration in time range)    Or  ondansetron (ZOFRAN) injection 4 mg (has no administration in time range)  butalbital-acetaminophen-caffeine (FIORICET) 50-325-40 MG per tablet 1 tablet (has no administration in time range)  0.9 %  sodium chloride  infusion (has no administration in time range)  albuterol (VENTOLIN HFA) 108 (90 Base) MCG/ACT inhaler 2 puff (has no administration in time range)  albuterol (PROVENTIL) (2.5 MG/3ML) 0.083% nebulizer solution 5 mg (5 mg Nebulization Given 05/11/19 1635)  methylPREDNISolone sodium succinate (SOLU-MEDROL) 125 mg/2 mL injection 125 mg (125 mg Intramuscular Given 05/11/19 1623)  acetaminophen (TYLENOL) tablet 650 mg (650 mg Oral Given 05/11/19 1634)  sodium chloride 0.9 % bolus 1,000 mL (1,000 mLs Intravenous New Bag/Given 05/11/19 1737)     ____________________________________________   INITIAL IMPRESSION / ASSESSMENT AND PLAN / ED COURSE  Pertinent labs & imaging results that were available during my care of the patient were reviewed by me and considered in my medical decision making (see chart for details).  Review of the [place] CSRS was performed in accordance of the [facility] prior to dispensing any controlled drugs.  Clinical Course as of May 10 1830  Wed May 11, 2019  1614 Patient presents the emergency department known Covid positive.  Patient received positive test results 3 days ago.  Patient has had symptoms for 5.  Patient has tachypnea, mildly increased work of breathing.  No adventitious lung sounds.  X-ray is concerning for Covid but no evidence of bacterial pneumonia.  Patient is given breathing treatment and steroid here in the emergency department and will be reassessed.   [JC]  1719 After albuterol, Solu-Medrol, patient reported no symptomatic improvement.  Patient was still tachypneic, as well as being febrile at this time.  Patient presented at 23.5 F, is now 102.8 F.  Patient is also tachycardic at 130.  Tachycardia is likely somewhat induced by albuterol, I do also suspect that this is in response to fever and ultimately COVID-19.  Patient was checked on oxygen saturation and was 91% after nebulizer, Solu-Medrol and resting.  Patient was stood, in the 3-4 steps prior to reaching  the doorway patient had desaturated to 88% and was feeling increased shortness of breath.  At this time patient was returned to bed, labs were ordered.  Patiently placed on supplemental oxygen, discussed with hospitalist for admission.   [JC]    Clinical Course User Index [JC] Dalessandro Baldyga, Delorise Royals, PA-C          Patient's diagnosis is consistent with COVID-19.  Shortness of breath.  Patient presented to the emergency department complaining of increasing shortness of breath with a known diagnosis of COVID-19.  Initially, patient did have tachypnea but was maintaining her oxygen saturation.  Attempts at symptom control with steroid and breathing treatments were ineffective and patient continued to have worsening shortness of breath, tachypnea, tachycardia, fever and hypoxia.  Patient was stood during ambulatory O2 saturation testing and decreased into the 80 percentile.  At this time I felt that patient would be suitable for admission for supportive care for COVID-19.  At this  time patient will be started on 2 L of supplemental oxygen via nasal cannula.  No indication at this time for aggressive airway management or signifi discussed the case with the hospitalist who was agreeable to admission.  Patient care will be transferred to the hospital service at this time..     ____________________________________________  FINAL CLINICAL IMPRESSION(S) / ED DIAGNOSES  Final diagnoses:  COVID-19  Shortness of breath  Hypoxia      NEW MEDICATIONS STARTED DURING THIS VISIT:  ED Discharge Orders    None          This chart was dictated using voice recognition software/Dragon. Despite best efforts to proofread, errors can occur which can change the meaning. Any change was purely unintentional.    Darletta Moll, PA-C 05/11/19 2023    Carrie Mew, MD 05/11/19 2038

## 2019-05-12 ENCOUNTER — Inpatient Hospital Stay: Payer: HRSA Program

## 2019-05-12 DIAGNOSIS — G43009 Migraine without aura, not intractable, without status migrainosus: Secondary | ICD-10-CM

## 2019-05-12 DIAGNOSIS — U071 COVID-19: Principal | ICD-10-CM

## 2019-05-12 DIAGNOSIS — J9601 Acute respiratory failure with hypoxia: Secondary | ICD-10-CM

## 2019-05-12 DIAGNOSIS — J1289 Other viral pneumonia: Secondary | ICD-10-CM

## 2019-05-12 LAB — CBC WITH DIFFERENTIAL/PLATELET
Abs Immature Granulocytes: 0.01 10*3/uL (ref 0.00–0.07)
Basophils Absolute: 0 10*3/uL (ref 0.0–0.1)
Basophils Relative: 0 %
Eosinophils Absolute: 0 10*3/uL (ref 0.0–0.5)
Eosinophils Relative: 0 %
HCT: 32.1 % — ABNORMAL LOW (ref 36.0–46.0)
Hemoglobin: 10.5 g/dL — ABNORMAL LOW (ref 12.0–15.0)
Immature Granulocytes: 0 %
Lymphocytes Relative: 16 %
Lymphs Abs: 0.6 10*3/uL — ABNORMAL LOW (ref 0.7–4.0)
MCH: 25.1 pg — ABNORMAL LOW (ref 26.0–34.0)
MCHC: 32.7 g/dL (ref 30.0–36.0)
MCV: 76.6 fL — ABNORMAL LOW (ref 80.0–100.0)
Monocytes Absolute: 0.1 10*3/uL (ref 0.1–1.0)
Monocytes Relative: 1 %
Neutro Abs: 2.9 10*3/uL (ref 1.7–7.7)
Neutrophils Relative %: 83 %
Platelets: 190 10*3/uL (ref 150–400)
RBC: 4.19 MIL/uL (ref 3.87–5.11)
RDW: 16 % — ABNORMAL HIGH (ref 11.5–15.5)
WBC: 3.5 10*3/uL — ABNORMAL LOW (ref 4.0–10.5)
nRBC: 0 % (ref 0.0–0.2)

## 2019-05-12 LAB — COMPREHENSIVE METABOLIC PANEL
ALT: 24 U/L (ref 0–44)
AST: 31 U/L (ref 15–41)
Albumin: 3.2 g/dL — ABNORMAL LOW (ref 3.5–5.0)
Alkaline Phosphatase: 46 U/L (ref 38–126)
Anion gap: 11 (ref 5–15)
BUN: 10 mg/dL (ref 6–20)
CO2: 21 mmol/L — ABNORMAL LOW (ref 22–32)
Calcium: 7.6 mg/dL — ABNORMAL LOW (ref 8.9–10.3)
Chloride: 107 mmol/L (ref 98–111)
Creatinine, Ser: 0.63 mg/dL (ref 0.44–1.00)
GFR calc Af Amer: >60 mL/min (ref >60)
GFR calc non Af Amer: >60 mL/min (ref >60)
Glucose, Bld: 208 mg/dL — ABNORMAL HIGH (ref 70–99)
Potassium: 3.4 mmol/L — ABNORMAL LOW (ref 3.5–5.1)
Sodium: 139 mmol/L (ref 135–145)
Total Bilirubin: 0.5 mg/dL (ref 0.3–1.2)
Total Protein: 7 g/dL (ref 6.5–8.1)

## 2019-05-12 LAB — LACTIC ACID, PLASMA: Lactic Acid, Venous: 3.3 mmol/L (ref 0.5–1.9)

## 2019-05-12 LAB — BASIC METABOLIC PANEL
Anion gap: 10 (ref 5–15)
BUN: 8 mg/dL (ref 6–20)
CO2: 22 mmol/L (ref 22–32)
Calcium: 7.8 mg/dL — ABNORMAL LOW (ref 8.9–10.3)
Chloride: 107 mmol/L (ref 98–111)
Creatinine, Ser: 0.56 mg/dL (ref 0.44–1.00)
GFR calc Af Amer: >60 mL/min (ref >60)
GFR calc non Af Amer: >60 mL/min (ref >60)
Glucose, Bld: 178 mg/dL — ABNORMAL HIGH (ref 70–99)
Potassium: 3.2 mmol/L — ABNORMAL LOW (ref 3.5–5.1)
Sodium: 139 mmol/L (ref 135–145)

## 2019-05-12 LAB — PHOSPHORUS: Phosphorus: 2.1 mg/dL — ABNORMAL LOW (ref 2.5–4.6)

## 2019-05-12 LAB — FERRITIN: Ferritin: 89 ng/mL (ref 11–307)

## 2019-05-12 LAB — MAGNESIUM
Magnesium: 1.9 mg/dL (ref 1.7–2.4)
Magnesium: 1.8 mg/dL (ref 1.7–2.4)

## 2019-05-12 LAB — VITAMIN D 25 HYDROXY (VIT D DEFICIENCY, FRACTURES): Vit D, 25-Hydroxy: 29.26 ng/mL — ABNORMAL LOW (ref 30–100)

## 2019-05-12 LAB — HIV ANTIBODY (ROUTINE TESTING W REFLEX): HIV Screen 4th Generation wRfx: NONREACTIVE

## 2019-05-12 LAB — C-REACTIVE PROTEIN: CRP: 4.8 mg/dL — ABNORMAL HIGH (ref <1.0)

## 2019-05-12 LAB — FIBRIN DERIVATIVES D-DIMER (ARMC ONLY): Fibrin derivatives D-dimer (ARMC): 518.16 ng/mL (FEU) — ABNORMAL HIGH (ref 0.00–499.00)

## 2019-05-12 MED ORDER — DIPHENHYDRAMINE HCL 25 MG PO CAPS
25.0000 mg | ORAL_CAPSULE | Freq: Every evening | ORAL | Status: DC | PRN
  Administered 2019-05-12 – 2019-05-16 (×4): 25 mg via ORAL
  Filled 2019-05-12 (×5): qty 1

## 2019-05-12 MED ORDER — POTASSIUM CHLORIDE CRYS ER 20 MEQ PO TBCR
40.0000 meq | EXTENDED_RELEASE_TABLET | Freq: Once | ORAL | Status: AC
  Administered 2019-05-13: 03:00:00 40 meq via ORAL
  Filled 2019-05-12: qty 2

## 2019-05-12 MED ORDER — FUROSEMIDE 10 MG/ML IJ SOLN
40.0000 mg | Freq: Once | INTRAMUSCULAR | Status: AC
  Administered 2019-05-12: 40 mg via INTRAVENOUS
  Filled 2019-05-12: qty 4

## 2019-05-12 MED ORDER — POTASSIUM CHLORIDE CRYS ER 20 MEQ PO TBCR
40.0000 meq | EXTENDED_RELEASE_TABLET | Freq: Once | ORAL | Status: AC
  Administered 2019-05-12: 13:00:00 40 meq via ORAL
  Filled 2019-05-12: qty 2

## 2019-05-12 NOTE — ED Notes (Signed)
Patient made aware sister called to speak with her. Patient reported X3 she did not want to call sister but she would keep texting her.

## 2019-05-12 NOTE — ED Notes (Signed)
Patient currently resting with eye closed. Will continue to monitor.

## 2019-05-12 NOTE — Progress Notes (Signed)
RN called with patient with shortness of breath with ambulation from bathroom, accompanied by decreased oxygen saturation and crackles in bilateral bases.  Chest xray with increasing infiltrates. Giving COVID pneumonia dx and recommendations, IV fluids stopped and IV lasix given

## 2019-05-12 NOTE — ED Notes (Signed)
Patient given breakfast tray.

## 2019-05-12 NOTE — Progress Notes (Signed)
PROGRESS NOTE    Amanda Weeks  [id number] DOB: 08/04/1975 DOA: 05/11/2019  PCP: Patient, No Pcp Per    LOS - 1   Brief Narrative:  43 y.o. female with medical history significant of migraines presented to the ED on 12/17 with worsening shortness of breath and fevers.  Onset of symptoms 5 days prior including fever, myalgias, nasal congestion, cough and shortness of breath.  Had been Covid tested and received positive result on Saturday PTA.  Had been doing okay at home, but started to notice her O2 sat dropping into 80's at home.  In the ED, she was initially afebrile, but spiked fever to 102.8, tachycardic, tachypneic, hypoxic at 88% on room air improved on 2 L/min oxygen.    Labs were notable for lactic acid 2.8, no leukocytosis, potassium 3.0  Chest x-ray showed bilateral pulmonary infiltrates most significant at the bases.  She was treated with Tylenol, albuterol neb, Solu-Medrol 125 mg, and 1 L fluid bolus.  Admitted for management of acute hypoxic respiratory failure secondary to pneumonia due to COVID-19.  Being Treated twith remdesivir and dexamethasone, in addition to vitamin C and zinc.  Subjective 12/17: Patient seen this AM.  Reports feeling about the same.  Notes her oxygen drops when she gets up to bathroom, recovers once she returns to bed and puts nasal cannula back on.  No fever/chills.  Still with intermittent dry hacking cough.  No acute events reported.  Assessment & Plan:   Principal Problem:   Pneumonia due to COVID-19 virus Active Problems:   Acute respiratory failure with hypoxia (HCC)   Migraine headache   Acute respiratory failure with hypoxia secondary to COVID-19 pneumonia Patient presented with fever, tachypnea, tachycardia, hypoxia and worsening shortness of breath. Chest x-ray with multifocal infiltrates, consistent with COVID-19 pneumonia.  Covid test was done in the outpatient setting on Friday, returned positive on Saturday, per  patient. -Remdesivir per pharmacy protocol - day 2 -Decadron IV - day 2 -Vitamin C and zinc -Supplemental oxygen to maintain O2 sat > 92% -Antitussives, antipyretics, inhaler -Maintain airborne and contact precautions -Monitor inflammatory markers daily -check vitamin D level and add supplement if low  History of migraine headaches -with active headache on admission.  Not on prescription medications at home.  Not relieved by Tylenol previously given. -Fioricet as needed   DVT prophylaxis: Lovenox   Code Status: Full Code  Family Communication: none at bedside  Disposition Plan:  Discharge home once weaned off oxygen and complete remdesivir.  Likely in 3 days.   Consultants:   None  Procedures:       None  Antimicrobials:   None    Objective: Vitals:   05/12/19 0615 05/12/19 0630 05/12/19 0645 05/12/19 0700  BP:      Pulse: 91 88 88 87  Resp: (!) 24 (!) 22 (!) 24 (!) 22  Temp:      TempSrc:      SpO2: 94% 95% 94% 94%  Weight:      Height:        Intake/Output Summary (Last 24 hours) at 05/12/2019 0814 Last data filed at 05/11/2019 1851 Gross per 24 hour  Intake 920 ml  Output --  Net 920 ml   Filed Weights   05/11/19 1521  Weight: 86.2 kg    Examination:  General exam: awake, alert, no acute distress HEENT: moist mucus membranes, hearing grossly normal  Respiratory system: crackles in bases bilaterally L>R, no wheezes or rhonchi, mildly increased respiratory  effort. Cardiovascular system: normal S1/S2, RRR, no JVD, murmurs, rubs, gallops, no pedal edema. Central nervous system: alert and oriented x4. no gross focal neurologic deficits, normal speech Extremities: moves all, no edema, normal tone Skin: warm, intact, diaphoretic Psychiatry: normal mood, congruent affect, judgement and insight appear normal    Data Reviewed: I have personally reviewed following labs and imaging studies  CBC: Recent Labs  Lab 05/11/19 1737 05/12/19 0402  WBC 6.0  3.5*  NEUTROABS 4.8 2.9  HGB 11.7* 10.5*  HCT 36.2 32.1*  MCV 79.2* 76.6*  PLT 201 190   Basic Metabolic Panel: Recent Labs  Lab 05/11/19 1737 05/12/19 0402  NA 137 139  K 3.0* 3.4*  CL 101 107  CO2 22 21*  GLUCOSE 137* 208*  BUN 10 10  CREATININE 0.57 0.63  CALCIUM 8.3* 7.6*  MG  --  1.9  PHOS  --  2.1*   GFR: Estimated Creatinine Clearance: 94.2 mL/min (by C-G formula based on SCr of 0.63 mg/dL). Liver Function Tests: Recent Labs  Lab 05/11/19 1737 05/12/19 0402  AST 33 31  ALT 26 24  ALKPHOS 53 46  BILITOT 0.6 0.5  PROT 7.6 7.0  ALBUMIN 3.6 3.2*   No results for input(s): LIPASE, AMYLASE in the last 168 hours. No results for input(s): AMMONIA in the last 168 hours. Coagulation Profile: No results for input(s): INR, PROTIME in the last 168 hours. Cardiac Enzymes: No results for input(s): CKTOTAL, CKMB, CKMBINDEX, TROPONINI in the last 168 hours. BNP (last 3 results) No results for input(s): PROBNP in the last 8760 hours. HbA1C: No results for input(s): HGBA1C in the last 72 hours. CBG: No results for input(s): GLUCAP in the last 168 hours. Lipid Profile: No results for input(s): CHOL, HDL, LDLCALC, TRIG, CHOLHDL, LDLDIRECT in the last 72 hours. Thyroid Function Tests: No results for input(s): TSH, T4TOTAL, FREET4, T3FREE, THYROIDAB in the last 72 hours. Anemia Panel: Recent Labs    05/12/19 0402  FERRITIN 89   Sepsis Labs: Recent Labs  Lab 05/11/19 1737  PROCALCITON <0.10  LATICACIDVEN 2.8*    No results found for this or any previous visit (from the past 240 hour(s)).       Radiology Studies: DG Chest 2 View  Result Date: 05/11/2019 CLINICAL DATA:  COVID-19 positive.  Shortness of breath. EXAM: CHEST - 2 VIEW COMPARISON:  June 28, 2018 FINDINGS: Bilateral pulmonary infiltrates, particularly in the bases, are consistent with the patient's COVID-19 status. No pneumothorax. The cardiomediastinal silhouette is normal. No other acute  abnormalities. IMPRESSION: Bilateral pulmonary infiltrates, particularly in the bases, consistent with the reported history of COVID-19 positive status and shortness of breath. Electronically Signed   By: Gerome Sam III M.D   On: 05/11/2019 15:49        Scheduled Meds: . albuterol  2 puff Inhalation Q6H  . vitamin C  500 mg Oral Daily  . dexamethasone (DECADRON) injection  6 mg Intravenous Q24H  . enoxaparin (LOVENOX) injection  40 mg Subcutaneous Q24H  . sodium chloride flush  3 mL Intravenous Q12H  . zinc sulfate  220 mg Oral Daily   Continuous Infusions: . sodium chloride 100 mL/hr at 05/12/19 0408  . remdesivir 100 mg in NS 100 mL       LOS: 1 day    Time spent: 30-35 minutes    Pennie Banter, DO [facility] Pager: [phone]  If 7PM-7AM, please contact night-coverage [email or web address] Password [email or web address] 05/12/2019, 8:14 AM

## 2019-05-13 LAB — CBC WITH DIFFERENTIAL/PLATELET
Abs Immature Granulocytes: 0.05 10*3/uL (ref 0.00–0.07)
Basophils Absolute: 0 10*3/uL (ref 0.0–0.1)
Basophils Relative: 0 %
Eosinophils Absolute: 0 10*3/uL (ref 0.0–0.5)
Eosinophils Relative: 0 %
HCT: 35.7 % — ABNORMAL LOW (ref 36.0–46.0)
Hemoglobin: 11.3 g/dL — ABNORMAL LOW (ref 12.0–15.0)
Immature Granulocytes: 1 %
Lymphocytes Relative: 13 %
Lymphs Abs: 1.3 10*3/uL (ref 0.7–4.0)
MCH: 25.2 pg — ABNORMAL LOW (ref 26.0–34.0)
MCHC: 31.7 g/dL (ref 30.0–36.0)
MCV: 79.5 fL — ABNORMAL LOW (ref 80.0–100.0)
Monocytes Absolute: 0.5 10*3/uL (ref 0.1–1.0)
Monocytes Relative: 5 %
Neutro Abs: 8.2 10*3/uL — ABNORMAL HIGH (ref 1.7–7.7)
Neutrophils Relative %: 81 %
Platelets: 236 10*3/uL (ref 150–400)
RBC: 4.49 MIL/uL (ref 3.87–5.11)
RDW: 16.1 % — ABNORMAL HIGH (ref 11.5–15.5)
WBC: 10 10*3/uL (ref 4.0–10.5)
nRBC: 0 % (ref 0.0–0.2)

## 2019-05-13 LAB — TROPONIN I (HIGH SENSITIVITY)
Troponin I (High Sensitivity): 3 ng/L (ref <18)
Troponin I (High Sensitivity): <2 ng/L (ref <18)

## 2019-05-13 LAB — COMPREHENSIVE METABOLIC PANEL
ALT: 24 U/L (ref 0–44)
AST: 28 U/L (ref 15–41)
Albumin: 3.6 g/dL (ref 3.5–5.0)
Alkaline Phosphatase: 47 U/L (ref 38–126)
Anion gap: 10 (ref 5–15)
BUN: 11 mg/dL (ref 6–20)
CO2: 24 mmol/L (ref 22–32)
Calcium: 7.8 mg/dL — ABNORMAL LOW (ref 8.9–10.3)
Chloride: 103 mmol/L (ref 98–111)
Creatinine, Ser: 0.62 mg/dL (ref 0.44–1.00)
GFR calc Af Amer: >60 mL/min (ref >60)
GFR calc non Af Amer: >60 mL/min (ref >60)
Glucose, Bld: 117 mg/dL — ABNORMAL HIGH (ref 70–99)
Potassium: 3.5 mmol/L (ref 3.5–5.1)
Sodium: 137 mmol/L (ref 135–145)
Total Bilirubin: 0.6 mg/dL (ref 0.3–1.2)
Total Protein: 7.3 g/dL (ref 6.5–8.1)

## 2019-05-13 LAB — PHOSPHORUS: Phosphorus: 1.6 mg/dL — ABNORMAL LOW (ref 2.5–4.6)

## 2019-05-13 LAB — C-REACTIVE PROTEIN: CRP: 1.2 mg/dL — ABNORMAL HIGH (ref <1.0)

## 2019-05-13 LAB — MAGNESIUM: Magnesium: 1.7 mg/dL (ref 1.7–2.4)

## 2019-05-13 LAB — LACTIC ACID, PLASMA
Lactic Acid, Venous: 3.7 mmol/L (ref 0.5–1.9)
Lactic Acid, Venous: 2.4 mmol/L (ref 0.5–1.9)

## 2019-05-13 LAB — FERRITIN: Ferritin: 103 ng/mL (ref 11–307)

## 2019-05-13 LAB — FIBRIN DERIVATIVES D-DIMER (ARMC ONLY): Fibrin derivatives D-dimer (ARMC): 421.2 ng/mL (FEU) (ref 0.00–499.00)

## 2019-05-13 LAB — BRAIN NATRIURETIC PEPTIDE: B Natriuretic Peptide: 47 pg/mL (ref 0.0–100.0)

## 2019-05-13 MED ORDER — K PHOS MONO-SOD PHOS DI & MONO 155-852-130 MG PO TABS
250.0000 mg | ORAL_TABLET | Freq: Two times a day (BID) | ORAL | Status: AC
  Administered 2019-05-13 (×2): 250 mg via ORAL
  Filled 2019-05-13 (×2): qty 1

## 2019-05-13 MED ORDER — IPRATROPIUM BROMIDE HFA 17 MCG/ACT IN AERS
2.0000 | INHALATION_SPRAY | RESPIRATORY_TRACT | Status: DC
  Administered 2019-05-13 – 2019-05-17 (×22): 2 via RESPIRATORY_TRACT
  Filled 2019-05-13: qty 12.9

## 2019-05-13 MED ORDER — SODIUM CHLORIDE 0.9 % IV SOLN
INTRAVENOUS | Status: DC | PRN
  Administered 2019-05-13 – 2019-05-15 (×3): 10 mL via INTRAVENOUS

## 2019-05-13 NOTE — Consult Note (Signed)
Remdesivir - Pharmacy Brief Note   O:  ALT: 26-->24 CRP: 4.8-->1.2 D-dimer: 518-->421 CXR: Bilateral pulmonary infiltrates, particularly in the bases, consistent with the reported history of COVID-19 positive status and shortness of breath. SpO2: 94-96 % on 2L O2  COVID + 05/07/2019   A/P:  Continue remdesivir 100 mg IVPB daily x 4 days total Completion date: 12/21  Vallery Sa, PharmD Clinical Pharmacist  05/13/2019 10:39 AM

## 2019-05-13 NOTE — Progress Notes (Addendum)
PROGRESS NOTE    Amanda Weeks  [id number] DOB: 13-Jan-1976 DOA: 05/11/2019  PCP: Patient, No Pcp Per    LOS - 2   Brief Narrative:  43 y.o.femalewith medical history significant ofmigraines presented to the ED on 12/17 with worsening shortness of breath and fevers. Onset of symptoms 5 days prior including fever, myalgias, nasal congestion, cough and shortness of breath. Had been Covid tested and received positive result on Saturday PTA.  Had been doing okay at home, but started to notice her O2 sat dropping into 80's at home.  In the ED, she was initially afebrile, but spiked fever to 102.8, tachycardic, tachypneic, hypoxic at 88% on room air improved on 2 L/min oxygen.   Labs were notable for lactic acid 2.8, no leukocytosis, potassium 3.0  Chest x-ray showed bilateral pulmonary infiltrates most significant at the bases. She was treated with Tylenol, albuterol neb, Solu-Medrol 125 mg, and 1 L fluid bolus. Admitted for management of acute hypoxic respiratory failure secondary to pneumonia due to COVID-19.  Being Treated twith remdesivir and dexamethasone, in addition to vitamin C and zinc.  Subjective 12/18: Patient awake in bed when seen this morning.  Overnight patient became hypoxic, now requiring oxygen again.  IV fluids were stopped and Lasix was given.  This morning patient states generally not feeling well, but denies fevers chills or shortness of breath at rest.  Does feel short of breath with movement.  Assessment & Plan:   Principal Problem:   Pneumonia due to COVID-19 virus Active Problems:   Acute respiratory failure with hypoxia (HCC)   Migraine headache   Acute respiratory failure with hypoxia secondary to COVID-19 pneumonia Patient presented with fever, tachypnea, tachycardia, hypoxia and worsening shortness of breath. Chest x-ray with multifocal infiltrates,consistent with COVID-19 pneumonia. Covid test was done in the outpatient setting on Friday,  returned positive on Saturday, per patient. -Remdesivir per pharmacy protocol - day 3 -Decadron IV - day 3 -Vitamin C and zinc -Supplemental oxygen to maintain O2 sat>92% -Antitussives, antipyretics, inhaler -Maintain airborne and contact precautions -Monitor inflammatory markers daily -check vitamin D level and add supplement if low  History of migraine headaches-with active headache on admission. Not on prescription medications at home. Not relieved by Tylenol previously given. -Fioricet as needed   DVT prophylaxis: Lovenox   Code Status: Full Code  Family Communication: none at bedside  Disposition Plan:  Discharge home once weaned off oxygen and complete remdesivir.  Pending further clinical improvement.  Expect she has a few more days.    Consultants:   None  Procedures:       None  Antimicrobials:   None     Objective: Vitals:   05/12/19 1341 05/12/19 1606 05/12/19 2047 05/13/19 0612  BP: 127/72 121/69 (!) 122/49 114/62  Pulse: (!) 105 79 94 (!) 113  Resp:  20  20  Temp: 97.6 F (36.4 C) 98.6 F (37 C) 98.1 F (36.7 C) 99 F (37.2 C)  TempSrc: Oral Oral  Oral  SpO2: 95% 97% 95% 96%  Weight:      Height:        Intake/Output Summary (Last 24 hours) at 05/13/2019 0859 Last data filed at 05/13/2019 0209 Gross per 24 hour  Intake 2446.3 ml  Output 2050 ml  Net 396.3 ml   Filed Weights   05/11/19 1521  Weight: 86.2 kg    Examination:  General exam: awake, alert, no acute distress Respiratory system: Coarse crackles in the left base otherwise clear  but diminished, no wheezes, rales or rhonchi, normal respiratory effort. Cardiovascular system: normal S1/S2, RRR, no JVD, murmurs, rubs, gallops, no pedal edema.   Central nervous system: alert and oriented x4. no gross focal neurologic deficits, normal speech Extremities: moves all, no edema, normal tone Psychiatry: normal mood, congruent affect, judgement and insight appear  normal    Data Reviewed: I have personally reviewed following labs and imaging studies  CBC: Recent Labs  Lab 05/11/19 1737 05/12/19 0402 05/13/19 0551  WBC 6.0 3.5* 10.0  NEUTROABS 4.8 2.9 8.2*  HGB 11.7* 10.5* 11.3*  HCT 36.2 32.1* 35.7*  MCV 79.2* 76.6* 79.5*  PLT 201 190 236   Basic Metabolic Panel: Recent Labs  Lab 05/11/19 1737 05/12/19 0402 05/12/19 2220 05/13/19 0551  NA 137 139 139 137  K 3.0* 3.4* 3.2* 3.5  CL 101 107 107 103  CO2 22 21* 22 24  GLUCOSE 137* 208* 178* 117*  BUN 10 10 8 11   CREATININE 0.57 0.63 0.56 0.62  CALCIUM 8.3* 7.6* 7.8* 7.8*  MG  --  1.9 1.8 1.7  PHOS  --  2.1*  --  1.6*   GFR: Estimated Creatinine Clearance: 94.2 mL/min (by C-G formula based on SCr of 0.62 mg/dL). Liver Function Tests: Recent Labs  Lab 05/11/19 1737 05/12/19 0402 05/13/19 0551  AST 33 31 28  ALT 26 24 24   ALKPHOS 53 46 47  BILITOT 0.6 0.5 0.6  PROT 7.6 7.0 7.3  ALBUMIN 3.6 3.2* 3.6   No results for input(s): LIPASE, AMYLASE in the last 168 hours. No results for input(s): AMMONIA in the last 168 hours. Coagulation Profile: No results for input(s): INR, PROTIME in the last 168 hours. Cardiac Enzymes: No results for input(s): CKTOTAL, CKMB, CKMBINDEX, TROPONINI in the last 168 hours. BNP (last 3 results) No results for input(s): PROBNP in the last 8760 hours. HbA1C: No results for input(s): HGBA1C in the last 72 hours. CBG: No results for input(s): GLUCAP in the last 168 hours. Lipid Profile: No results for input(s): CHOL, HDL, LDLCALC, TRIG, CHOLHDL, LDLDIRECT in the last 72 hours. Thyroid Function Tests: No results for input(s): TSH, T4TOTAL, FREET4, T3FREE, THYROIDAB in the last 72 hours. Anemia Panel: Recent Labs    05/12/19 0402 05/13/19 0551  FERRITIN 89 103   Sepsis Labs: Recent Labs  Lab 05/11/19 1737 05/12/19 2220  PROCALCITON <0.10  --   LATICACIDVEN 2.8* 3.3*    No results found for this or any previous visit (from the past  240 hour(s)).       Radiology Studies: DG Chest 2 View  Result Date: 05/11/2019 CLINICAL DATA:  COVID-19 positive.  Shortness of breath. EXAM: CHEST - 2 VIEW COMPARISON:  June 28, 2018 FINDINGS: Bilateral pulmonary infiltrates, particularly in the bases, are consistent with the patient's COVID-19 status. No pneumothorax. The cardiomediastinal silhouette is normal. No other acute abnormalities. IMPRESSION: Bilateral pulmonary infiltrates, particularly in the bases, consistent with the reported history of COVID-19 positive status and shortness of breath. Electronically Signed   By: Gerome Samavid  Williams III M.D   On: 05/11/2019 15:49   DG Chest Port 1 View  Result Date: 05/12/2019 CLINICAL DATA:  Hypoxia. COVID-19. EXAM: PORTABLE CHEST 1 VIEW COMPARISON:  Chest x-rays dated 05/11/2019 and 06/28/2018 FINDINGS: The bilateral pulmonary infiltrates have appreciably progressed since the prior study. Heart size and vascularity remain normal. No effusions. No significant bone abnormality. IMPRESSION: Progressive bilateral pulmonary infiltrates since the prior study. This is consistent with COVID-19 pneumonia. Electronically Signed  By: Francene Boyers M.D.   On: 05/12/2019 21:36        Scheduled Meds: . albuterol  2 puff Inhalation Q6H  . vitamin C  500 mg Oral Daily  . dexamethasone (DECADRON) injection  6 mg Intravenous Q24H  . enoxaparin (LOVENOX) injection  40 mg Subcutaneous Q24H  . ipratropium  2 puff Inhalation Q4H  . phosphorus  250 mg Oral BID  . sodium chloride flush  3 mL Intravenous Q12H  . zinc sulfate  220 mg Oral Daily   Continuous Infusions: . remdesivir 100 mg in NS 100 mL Stopped (05/12/19 1512)     LOS: 2 days    Time spent: 30 to 35 minutes    Pennie Banter, DO [facility] Pager: [phone]  If 7PM-7AM, please contact night-coverage [email or web address] Password [email or web address] 05/13/2019, 8:59 AM

## 2019-05-14 LAB — CBC WITH DIFFERENTIAL/PLATELET
Abs Immature Granulocytes: 0.03 10*3/uL (ref 0.00–0.07)
Basophils Absolute: 0 10*3/uL (ref 0.0–0.1)
Basophils Relative: 0 %
Eosinophils Absolute: 0 10*3/uL (ref 0.0–0.5)
Eosinophils Relative: 0 %
HCT: 33.8 % — ABNORMAL LOW (ref 36.0–46.0)
Hemoglobin: 10.9 g/dL — ABNORMAL LOW (ref 12.0–15.0)
Immature Granulocytes: 1 %
Lymphocytes Relative: 28 %
Lymphs Abs: 1.4 10*3/uL (ref 0.7–4.0)
MCH: 25.7 pg — ABNORMAL LOW (ref 26.0–34.0)
MCHC: 32.2 g/dL (ref 30.0–36.0)
MCV: 79.7 fL — ABNORMAL LOW (ref 80.0–100.0)
Monocytes Absolute: 0.4 10*3/uL (ref 0.1–1.0)
Monocytes Relative: 9 %
Neutro Abs: 3.1 10*3/uL (ref 1.7–7.7)
Neutrophils Relative %: 62 %
Platelets: 242 10*3/uL (ref 150–400)
RBC: 4.24 MIL/uL (ref 3.87–5.11)
RDW: 16.2 % — ABNORMAL HIGH (ref 11.5–15.5)
WBC: 5 10*3/uL (ref 4.0–10.5)
nRBC: 0 % (ref 0.0–0.2)

## 2019-05-14 LAB — COMPREHENSIVE METABOLIC PANEL
ALT: 103 U/L — ABNORMAL HIGH (ref 0–44)
AST: 86 U/L — ABNORMAL HIGH (ref 15–41)
Albumin: 3.3 g/dL — ABNORMAL LOW (ref 3.5–5.0)
Alkaline Phosphatase: 47 U/L (ref 38–126)
Anion gap: 10 (ref 5–15)
BUN: 13 mg/dL (ref 6–20)
CO2: 27 mmol/L (ref 22–32)
Calcium: 7.9 mg/dL — ABNORMAL LOW (ref 8.9–10.3)
Chloride: 103 mmol/L (ref 98–111)
Creatinine, Ser: 0.42 mg/dL — ABNORMAL LOW (ref 0.44–1.00)
GFR calc Af Amer: >60 mL/min (ref >60)
GFR calc non Af Amer: >60 mL/min (ref >60)
Glucose, Bld: 108 mg/dL — ABNORMAL HIGH (ref 70–99)
Potassium: 3.5 mmol/L (ref 3.5–5.1)
Sodium: 140 mmol/L (ref 135–145)
Total Bilirubin: 0.7 mg/dL (ref 0.3–1.2)
Total Protein: 6.8 g/dL (ref 6.5–8.1)

## 2019-05-14 LAB — FIBRIN DERIVATIVES D-DIMER (ARMC ONLY): Fibrin derivatives D-dimer (ARMC): 383.9 ng/mL (FEU) (ref 0.00–499.00)

## 2019-05-14 LAB — C-REACTIVE PROTEIN: CRP: 3 mg/dL — ABNORMAL HIGH (ref <1.0)

## 2019-05-14 LAB — MAGNESIUM: Magnesium: 2 mg/dL (ref 1.7–2.4)

## 2019-05-14 LAB — FERRITIN: Ferritin: 93 ng/mL (ref 11–307)

## 2019-05-14 LAB — PHOSPHORUS: Phosphorus: 3.2 mg/dL (ref 2.5–4.6)

## 2019-05-14 NOTE — Progress Notes (Signed)
PROGRESS NOTE    Juanda CrumbleMelissa F Steidle  AVW:[id number] DOB: 04/16/1976 DOA: 05/11/2019  PCP: Patient, No Pcp Per    LOS - 3   Brief Narrative:  43 y.o.femalewith medical history significant ofmigraines [facility]ors[facility] of breath and fevers.Onset of symptoms 5 dayspriorincluding fever, myalgias, nasal congestion, cough and shortness of breath.Had been Covid tested and received positive result on Saturday PTA. Had been doing okay at home, but started to notice her O2 sat dropping into 80's at home. In the ED, she was initially afebrile, but spiked fever to 102.8, tachycardic, tachypneic, hypoxic at 88% on room air improved on 2 L/min oxygen.Labs were notable for lactic acid 2.8, no leukocytosis, potassium 3.0Chest x-ray showed bilateral pulmonary infiltrates most significant at the bases. She was treated with Tylenol, albuterol neb, Solu-Medrol 125 mg, and 1 L fluid bolus. Admitted for management of acute hypoxic respiratory failure secondary to pneumonia due to COVID-19. Being Treated with remdesivir and dexamethasone, in addition to vitamin C and zinc, supplemental oxygen.  Continues to require 2 L/min O2.  Subjective 12/19: Patient awake sitting up in bed when seen this AM.  RN reported need to turn O2 up to 3L/min with ambulating to bathroom.  Patient reports feeling like her breathing is getting better.  Less coughing with inspirations.  Using incentive spirometer.  No acute events reported.  Assessment & Plan:   Principal Problem:   Pneumonia due to COVID-19 virus Active Problems:   Acute respiratory failure with hypoxia (HCC)   Migraine headache   Acute respiratory failure with hypoxia secondary to COVID-19 pneumonia Patient presented with fever, tachypnea, tachycardia, hypoxia and worsening shortness of breath. Chest x-ray with multifocal infiltrates,consistent with COVID-19 pneumonia. Covid test was done in the outpatient  setting on Friday, returned positive on Saturday, per patient. -Remdesivir per pharmacy protocol- last day 12/21 -Decadron IV- day 4 -Vitamin C and zinc -Supplemental oxygen to maintain O2 sat>92%, wean off as tolerated -Antitussives, antipyretics, inhaler -Maintain airborne and contact precautions -Monitor inflammatory markers daily  History of migraine headaches-with active headache on admission. Not on prescription medications at home. Not relieved by Tylenol previously given. -Fioricet as needed   DVT prophylaxis: Lovenox   Code Status: Full Code  Family Communication: none at bedside  Disposition Plan:  Discharge home after finish Remdesivir 12/21, hopefully weaned off O2 by then.   Consultants:   None  Procedures:   None  Antimicrobials:   None    Objective: Vitals:   05/13/19 1820 05/13/19 1823 05/13/19 2005 05/14/19 0547  BP: (!) 90/46 (!) 109/49 101/63 (!) 93/46  Pulse: 87  79 77  Resp: 20 (!) 22 20 20   Temp: 98.4 F (36.9 C)  98.4 F (36.9 C) 97.9 F (36.6 C)  TempSrc: Oral  Oral Oral  SpO2: 93%  94% 97%  Weight:      Height:        Intake/Output Summary (Last 24 hours) at 05/14/2019 [id number] Last data filed at 05/14/2019 0750 Gross per 24 hour  Intake 229.05 ml  Output 500 ml  Net -270.95 ml   Filed Weights   05/11/19 1521  Weight: 86.2 kg    Examination:  General exam: awake, alert, no acute distress HEENT: moist mucus membranes, hearing grossly normal  Respiratory system: better air movement today, still somewhat diminished with coarse rhonchi in lower left, normal respiratory effort at rest. Cardiovascular system: normal S1/S2, RRR, no JVD, murmurs, rubs, gallops, no pedal edema.   Central  nervous system: alert and oriented x4. no gross focal neurologic deficits, normal speech Extremities: moves all, no edema, normal tone Psychiatry: normal mood, congruent affect, judgement and insight appear normal    Data Reviewed: I have  personally reviewed following labs and imaging studies  CBC: Recent Labs  Lab 05/11/19 1737 05/12/19 0402 05/13/19 0551 05/14/19 0558  WBC 6.0 3.5* 10.0 5.0  NEUTROABS 4.8 2.9 8.2* 3.1  HGB 11.7* 10.5* 11.3* 10.9*  HCT 36.2 32.1* 35.7* 33.8*  MCV 79.2* 76.6* 79.5* 79.7*  PLT 201 190 236 841   Basic Metabolic Panel: Recent Labs  Lab 05/11/19 1737 05/12/19 0402 05/12/19 2220 05/13/19 0551 05/14/19 0558  NA 137 139 139 137 140  K 3.0* 3.4* 3.2* 3.5 3.5  CL 101 107 107 103 103  CO2 22 21* 22 24 27   GLUCOSE 137* 208* 178* 117* 108*  BUN 10 10 8 11 13   CREATININE 0.57 0.63 0.56 0.62 0.42*  CALCIUM 8.3* 7.6* 7.8* 7.8* 7.9*  MG  --  1.9 1.8 1.7 2.0  PHOS  --  2.1*  --  1.6* 3.2   GFR: Estimated Creatinine Clearance: 94.2 mL/min (A) (by C-G formula based on SCr of 0.42 mg/dL (L)). Liver Function Tests: Recent Labs  Lab 05/11/19 1737 05/12/19 0402 05/13/19 0551 05/14/19 0558  AST 33 31 28 86*  ALT 26 24 24  103*  ALKPHOS 53 46 47 47  BILITOT 0.6 0.5 0.6 0.7  PROT 7.6 7.0 7.3 6.8  ALBUMIN 3.6 3.2* 3.6 3.3*   No results for input(s): LIPASE, AMYLASE in the last 168 hours. No results for input(s): AMMONIA in the last 168 hours. Coagulation Profile: No results for input(s): INR, PROTIME in the last 168 hours. Cardiac Enzymes: No results for input(s): CKTOTAL, CKMB, CKMBINDEX, TROPONINI in the last 168 hours. BNP (last 3 results) No results for input(s): PROBNP in the last 8760 hours. HbA1C: No results for input(s): HGBA1C in the last 72 hours. CBG: No results for input(s): GLUCAP in the last 168 hours. Lipid Profile: No results for input(s): CHOL, HDL, LDLCALC, TRIG, CHOLHDL, LDLDIRECT in the last 72 hours. Thyroid Function Tests: No results for input(s): TSH, T4TOTAL, FREET4, T3FREE, THYROIDAB in the last 72 hours. Anemia Panel: Recent Labs    05/13/19 0551 05/14/19 0558  FERRITIN 103 93   Sepsis Labs: Recent Labs  Lab 05/11/19 1737 05/12/19 2220  05/13/19 1317 05/13/19 1653  PROCALCITON <0.10  --   --   --   LATICACIDVEN 2.8* 3.3* 3.7* 2.4*    No results found for this or any previous visit (from the past 240 hour(s)).       Radiology Studies: DG Chest Port 1 View  Result Date: 05/12/2019 CLINICAL DATA:  Hypoxia. COVID-19. EXAM: PORTABLE CHEST 1 VIEW COMPARISON:  Chest x-rays dated 05/11/2019 and 06/28/2018 FINDINGS: The bilateral pulmonary infiltrates have appreciably progressed since the prior study. Heart size and vascularity remain normal. No effusions. No significant bone abnormality. IMPRESSION: Progressive bilateral pulmonary infiltrates since the prior study. This is consistent with COVID-19 pneumonia. Electronically Signed   By: Lorriane Shire M.D.   On: 05/12/2019 21:36        Scheduled Meds: . albuterol  2 puff Inhalation Q6H  . vitamin C  500 mg Oral Daily  . dexamethasone (DECADRON) injection  6 mg Intravenous Q24H  . enoxaparin (LOVENOX) injection  40 mg Subcutaneous Q24H  . ipratropium  2 puff Inhalation Q4H  . sodium chloride flush  3 mL Intravenous Q12H  .  zinc sulfate  220 mg Oral Daily   Continuous Infusions: . sodium chloride Stopped (05/13/19 1139)  . remdesivir 100 mg in NS 100 mL Stopped (05/13/19 1045)     LOS: 3 days    Time spent: 25-30 minutes    Pennie Banter, DO [facility] Pager: [phone]  If 7PM-7AM, please contact night-coverage [email or web address] Password [email or web address] 05/14/2019, 8:12 AM

## 2019-05-15 LAB — CBC WITH DIFFERENTIAL/PLATELET
Abs Immature Granulocytes: 0.1 10*3/uL — ABNORMAL HIGH (ref 0.00–0.07)
Basophils Absolute: 0 10*3/uL (ref 0.0–0.1)
Basophils Relative: 0 %
Eosinophils Absolute: 0 10*3/uL (ref 0.0–0.5)
Eosinophils Relative: 0 %
HCT: 32.5 % — ABNORMAL LOW (ref 36.0–46.0)
Hemoglobin: 10.7 g/dL — ABNORMAL LOW (ref 12.0–15.0)
Immature Granulocytes: 2 %
Lymphocytes Relative: 25 %
Lymphs Abs: 1.6 10*3/uL (ref 0.7–4.0)
MCH: 25.2 pg — ABNORMAL LOW (ref 26.0–34.0)
MCHC: 32.9 g/dL (ref 30.0–36.0)
MCV: 76.5 fL — ABNORMAL LOW (ref 80.0–100.0)
Monocytes Absolute: 0.5 10*3/uL (ref 0.1–1.0)
Monocytes Relative: 7 %
Neutro Abs: 4.3 10*3/uL (ref 1.7–7.7)
Neutrophils Relative %: 66 %
Platelets: 263 10*3/uL (ref 150–400)
RBC: 4.25 MIL/uL (ref 3.87–5.11)
RDW: 15.9 % — ABNORMAL HIGH (ref 11.5–15.5)
WBC: 6.5 10*3/uL (ref 4.0–10.5)
nRBC: 0 % (ref 0.0–0.2)

## 2019-05-15 LAB — COMPREHENSIVE METABOLIC PANEL
ALT: 69 U/L — ABNORMAL HIGH (ref 0–44)
AST: 27 U/L (ref 15–41)
Albumin: 3.3 g/dL — ABNORMAL LOW (ref 3.5–5.0)
Alkaline Phosphatase: 43 U/L (ref 38–126)
Anion gap: 10 (ref 5–15)
BUN: 15 mg/dL (ref 6–20)
CO2: 27 mmol/L (ref 22–32)
Calcium: 8.3 mg/dL — ABNORMAL LOW (ref 8.9–10.3)
Chloride: 102 mmol/L (ref 98–111)
Creatinine, Ser: 0.4 mg/dL — ABNORMAL LOW (ref 0.44–1.00)
GFR calc Af Amer: >60 mL/min (ref >60)
GFR calc non Af Amer: >60 mL/min (ref >60)
Glucose, Bld: 112 mg/dL — ABNORMAL HIGH (ref 70–99)
Potassium: 3.5 mmol/L (ref 3.5–5.1)
Sodium: 139 mmol/L (ref 135–145)
Total Bilirubin: 0.6 mg/dL (ref 0.3–1.2)
Total Protein: 6.6 g/dL (ref 6.5–8.1)

## 2019-05-15 LAB — MAGNESIUM: Magnesium: 1.9 mg/dL (ref 1.7–2.4)

## 2019-05-15 LAB — FIBRIN DERIVATIVES D-DIMER (ARMC ONLY): Fibrin derivatives D-dimer (ARMC): 413.48 ng/mL (FEU) (ref 0.00–499.00)

## 2019-05-15 LAB — C-REACTIVE PROTEIN: CRP: 1.7 mg/dL — ABNORMAL HIGH (ref <1.0)

## 2019-05-15 LAB — FERRITIN: Ferritin: 56 ng/mL (ref 11–307)

## 2019-05-15 LAB — PHOSPHORUS: Phosphorus: 3.8 mg/dL (ref 2.5–4.6)

## 2019-05-15 MED ORDER — MAGIC MOUTHWASH
5.0000 mL | Freq: Four times a day (QID) | ORAL | Status: DC
  Administered 2019-05-15 – 2019-05-17 (×5): 5 mL via ORAL
  Filled 2019-05-15 (×14): qty 10

## 2019-05-15 MED ORDER — SALINE SPRAY 0.65 % NA SOLN
1.0000 | NASAL | Status: DC | PRN
  Administered 2019-05-15: 11:00:00 1 via NASAL
  Filled 2019-05-15 (×2): qty 44

## 2019-05-15 MED ORDER — ALBUTEROL SULFATE HFA 108 (90 BASE) MCG/ACT IN AERS
2.0000 | INHALATION_SPRAY | RESPIRATORY_TRACT | Status: DC | PRN
  Administered 2019-05-15 – 2019-05-17 (×4): 2 via RESPIRATORY_TRACT
  Filled 2019-05-15: qty 6.7

## 2019-05-15 NOTE — Progress Notes (Signed)
PROGRESS NOTE    Amanda Weeks  [id number] DOB: [birth year] DOA: 05/11/2019  PCP: Patient, No Pcp Per    LOS - 4   Brief Narrative:  43 y.o.femalewith medical history significant ofmigraines presented to the [facility] 12/17 withworsening shortness of breath and fevers.Onset of symptoms 5 dayspriorincluding fever, myalgias, nasal congestion, cough and shortness of breath.Had been Covid tested and received positive result on Saturday PTA. Had been doing okay at home, but started to notice her O2 sat dropping into 80's at home. In the ED, she was initially afebrile, but spiked fever to 102.8, tachycardic, tachypneic, hypoxic at 88% on room air improved on 2 L/min oxygen.Labs were notable for lactic acid 2.8, no leukocytosis, potassium 3.0Chest x-ray showed bilateral pulmonary infiltrates most significant at the bases. She was treated with Tylenol, albuterol neb, Solu-Medrol 125 mg, and 1 L fluid bolus. Admitted for management of acute hypoxic respiratory failure secondary to pneumonia due to COVID-19. Being Treated with remdesivir and dexamethasone, in addition to vitamin C and zinc, supplemental oxygen.  Continues to require 2 L/min O2.  Subjective 12/20: Patient awake seen sitting up in bed this morning.  She reports that she started to feel better.  Less coughing when she takes deep breaths.  No fevers or chills.  Per RN, patient's O2 sat dropped while trying to wean oxygen this morning.  Assessment & Plan:   Principal Problem:   Pneumonia due to COVID-19 virus Active Problems:   Acute respiratory failure with hypoxia (HCC)   Migraine headache  Acute respiratory failure with hypoxia secondary to COVID-19 pneumonia Patient presented with fever, tachypnea, tachycardia, hypoxia and worsening shortness of breath. Chest x-ray with multifocal infiltrates,consistent with COVID-19 pneumonia. Covid test was done in the outpatient setting on Friday, returned positive on  Saturday, per patient. -Remdesivir per pharmacy protocol- last day 12/21 -Decadron IV- day4 -Vitamin C and zinc -Supplemental oxygen to maintain O2 sat>92%, wean off as tolerated -Antitussives, antipyretics, inhaler -Increase albuterol inhaler to every 2 hours as needed -Maintain airborne and contact precautions -Monitor inflammatory markers daily  History of migraine headaches-with active headache on admission. Not on prescription medications at home. Not relieved by Tylenol previously given. -Fioricet as needed   DVT prophylaxis: Lovenox   Code Status: Full Code  Family Communication: none at bedside  Disposition Plan:  Expect d/c home, hopefully 12/21 after last remdesivir, pending further improvement and wean off oxygen   Consultants:   None  Procedures:   None  Antimicrobials:   None    Objective: Vitals:   05/14/19 1700 05/14/19 1735 05/14/19 1953 05/15/19 0327  BP:  (!) 111/55 115/72 101/73  Pulse:  72 83 69  Resp:  19 20 20   Temp:  98.7 F (37.1 C) 98 F (36.7 C) 97.9 F (36.6 C)  TempSrc:  Oral Oral Oral  SpO2: 93% 96% 93% 92%  Weight:      Height:        Intake/Output Summary (Last 24 hours) at 05/15/2019 0850 Last data filed at 05/14/2019 1407 Gross per 24 hour  Intake 110.05 ml  Output -  Net 110.05 ml   Filed Weights   05/11/19 1521  Weight: 86.2 kg    Examination:  General exam: awake, alert, no acute distress HEENT: moist mucus membranes, hearing grossly normal  Respiratory system: Diffuse expiratory wheezes right more than left, no rales or rhonchi, normal respiratory effort, on 2 L/min oxygen [place]. Cardiovascular system: normal S1/S2, RRR, no JVD, murmurs, rubs, gallops, no pedal  edema.   Central nervous system: alert and oriented x4. no gross focal neurologic deficits, normal speech Extremities: moves all, no edema, normal tone Skin: dry, intact, normal temperature Psychiatry: normal mood, congruent affect, judgement and  insight appear normal    Data Reviewed: I have personally reviewed following labs and imaging studies  CBC: Recent Labs  Lab 05/11/19 1737 05/12/19 0402 05/13/19 0551 05/14/19 0558 05/15/19 0441  WBC 6.0 3.5* 10.0 5.0 6.5  NEUTROABS 4.8 2.9 8.2* 3.1 4.3  HGB 11.7* 10.5* 11.3* 10.9* 10.7*  HCT 36.2 32.1* 35.7* 33.8* 32.5*  MCV 79.2* 76.6* 79.5* 79.7* 76.5*  PLT 201 190 236 242 263   Basic Metabolic Panel: Recent Labs  Lab 05/12/19 0402 05/12/19 2220 05/13/19 0551 05/14/19 0558 05/15/19 0441  NA 139 139 137 140 139  K 3.4* 3.2* 3.5 3.5 3.5  CL 107 107 103 103 102  CO2 21* 22 24 27 27   GLUCOSE 208* 178* 117* 108* 112*  BUN 10 8 11 13 15   CREATININE 0.63 0.56 0.62 0.42* 0.40*  CALCIUM 7.6* 7.8* 7.8* 7.9* 8.3*  MG 1.9 1.8 1.7 2.0 1.9  PHOS 2.1*  --  1.6* 3.2 3.8   GFR: Estimated Creatinine Clearance: 94.2 mL/min (A) (by C-G formula based on SCr of 0.4 mg/dL (L)). Liver Function Tests: Recent Labs  Lab 05/11/19 1737 05/12/19 0402 05/13/19 0551 05/14/19 0558 05/15/19 0441  AST 33 31 28 86* 27  ALT 26 24 24  103* 69*  ALKPHOS 53 46 47 47 43  BILITOT 0.6 0.5 0.6 0.7 0.6  PROT 7.6 7.0 7.3 6.8 6.6  ALBUMIN 3.6 3.2* 3.6 3.3* 3.3*   No results for input(s): LIPASE, AMYLASE in the last 168 hours. No results for input(s): AMMONIA in the last 168 hours. Coagulation Profile: No results for input(s): INR, PROTIME in the last 168 hours. Cardiac Enzymes: No results for input(s): CKTOTAL, CKMB, CKMBINDEX, TROPONINI in the last 168 hours. BNP (last 3 results) No results for input(s): PROBNP in the last 8760 hours. HbA1C: No results for input(s): HGBA1C in the last 72 hours. CBG: No results for input(s): GLUCAP in the last 168 hours. Lipid Profile: No results for input(s): CHOL, HDL, LDLCALC, TRIG, CHOLHDL, LDLDIRECT in the last 72 hours. Thyroid Function Tests: No results for input(s): TSH, T4TOTAL, FREET4, T3FREE, THYROIDAB in the last 72 hours. Anemia Panel:  Recent Labs    05/14/19 0558 05/15/19 0441  FERRITIN 93 56   Sepsis Labs: Recent Labs  Lab 05/11/19 1737 05/12/19 2220 05/13/19 1317 05/13/19 1653  PROCALCITON <0.10  --   --   --   LATICACIDVEN 2.8* 3.3* 3.7* 2.4*    No results found for this or any previous visit (from the past 240 hour(s)).       Radiology Studies: No results found.      Scheduled Meds: . albuterol  2 puff Inhalation Q6H  . vitamin C  500 mg Oral Daily  . dexamethasone (DECADRON) injection  6 mg Intravenous Q24H  . enoxaparin (LOVENOX) injection  40 mg Subcutaneous Q24H  . ipratropium  2 puff Inhalation Q4H  . sodium chloride flush  3 mL Intravenous Q12H  . zinc sulfate  220 mg Oral Daily   Continuous Infusions: . sodium chloride Stopped (05/14/19 1059)  . remdesivir 100 mg in NS 100 mL Stopped (05/14/19 1000)     LOS: 4 days    Time spent: 25-30 minutes    05/15/19, DO [facility]   If 7PM-7AM, please  contact night-coverage [email or web address] Password [email or web address] 05/15/2019, 8:50 AM

## 2019-05-16 LAB — COMPREHENSIVE METABOLIC PANEL
ALT: 48 U/L — ABNORMAL HIGH (ref 0–44)
AST: 17 U/L (ref 15–41)
Albumin: 3.1 g/dL — ABNORMAL LOW (ref 3.5–5.0)
Alkaline Phosphatase: 40 U/L (ref 38–126)
Anion gap: 14 (ref 5–15)
BUN: 16 mg/dL (ref 6–20)
CO2: 23 mmol/L (ref 22–32)
Calcium: 8.3 mg/dL — ABNORMAL LOW (ref 8.9–10.3)
Chloride: 103 mmol/L (ref 98–111)
Creatinine, Ser: 0.41 mg/dL — ABNORMAL LOW (ref 0.44–1.00)
GFR calc Af Amer: >60 mL/min (ref >60)
GFR calc non Af Amer: >60 mL/min (ref >60)
Glucose, Bld: 103 mg/dL — ABNORMAL HIGH (ref 70–99)
Potassium: 3.7 mmol/L (ref 3.5–5.1)
Sodium: 140 mmol/L (ref 135–145)
Total Bilirubin: 0.6 mg/dL (ref 0.3–1.2)
Total Protein: 6.5 g/dL (ref 6.5–8.1)

## 2019-05-16 LAB — CBC WITH DIFFERENTIAL/PLATELET
Abs Immature Granulocytes: 0.18 10*3/uL — ABNORMAL HIGH (ref 0.00–0.07)
Basophils Absolute: 0 10*3/uL (ref 0.0–0.1)
Basophils Relative: 0 %
Eosinophils Absolute: 0 10*3/uL (ref 0.0–0.5)
Eosinophils Relative: 0 %
HCT: 33.1 % — ABNORMAL LOW (ref 36.0–46.0)
Hemoglobin: 11 g/dL — ABNORMAL LOW (ref 12.0–15.0)
Immature Granulocytes: 2 %
Lymphocytes Relative: 26 %
Lymphs Abs: 2.3 10*3/uL (ref 0.7–4.0)
MCH: 25.4 pg — ABNORMAL LOW (ref 26.0–34.0)
MCHC: 33.2 g/dL (ref 30.0–36.0)
MCV: 76.4 fL — ABNORMAL LOW (ref 80.0–100.0)
Monocytes Absolute: 0.6 10*3/uL (ref 0.1–1.0)
Monocytes Relative: 7 %
Neutro Abs: 5.5 10*3/uL (ref 1.7–7.7)
Neutrophils Relative %: 65 %
Platelets: 297 10*3/uL (ref 150–400)
RBC: 4.33 MIL/uL (ref 3.87–5.11)
RDW: 15.9 % — ABNORMAL HIGH (ref 11.5–15.5)
Smear Review: NORMAL
WBC: 8.6 10*3/uL (ref 4.0–10.5)
nRBC: 0 % (ref 0.0–0.2)

## 2019-05-16 LAB — FIBRIN DERIVATIVES D-DIMER (ARMC ONLY): Fibrin derivatives D-dimer (ARMC): 308.92 ng/mL (FEU) (ref 0.00–499.00)

## 2019-05-16 LAB — MAGNESIUM: Magnesium: 2 mg/dL (ref 1.7–2.4)

## 2019-05-16 LAB — FERRITIN: Ferritin: 50 ng/mL (ref 11–307)

## 2019-05-16 LAB — PHOSPHORUS: Phosphorus: 3.5 mg/dL (ref 2.5–4.6)

## 2019-05-16 LAB — C-REACTIVE PROTEIN: CRP: 0.7 mg/dL (ref <1.0)

## 2019-05-16 MED ORDER — SODIUM CHLORIDE 0.9 % IV SOLN: INTRAVENOUS | Status: AC

## 2019-05-16 NOTE — Progress Notes (Signed)
PROGRESS NOTE    Amanda Weeks  [id number] DOB: 1976-03-02 DOA: 05/11/2019  PCP: Patient, No Pcp Per    LOS - 5   Brief Narrative:  42 y.o.femalewith medical history significant ofmigraines presented to the [facility] 12/17 withworsening shortness of breath and fevers.Onset of symptoms 5 dayspriorincluding fever, myalgias, nasal congestion, cough and shortness of breath.Had been Covid tested and received positive result on Saturday PTA. Had been doing okay at home, but started to notice her O2 sat dropping into 80's at home. In the ED, she was initially afebrile, but spiked fever to 102.8, tachycardic, tachypneic, hypoxic at 88% on room air improved on 2 L/min oxygen.Labs were notable for lactic acid 2.8, no leukocytosis, potassium 3.0Chest x-ray showed bilateral pulmonary infiltrates most significant at the bases. She was treated with Tylenol, albuterol neb, Solu-Medrol 125 mg, and 1 L fluid bolus. Admitted for management of acute hypoxic respiratory failure secondary to pneumonia due to COVID-19. Being Treated with remdesivir and dexamethasone, in addition to vitamin C and zinc, supplemental oxygen.Continues to require 2 L/min O2.  Subjective 12/21: Patient seen this AM sitting up in bed.  No acute events reported.  RN ambulated with patient off oxygen and found she does still require 1 L/min oxygen to keep her sat over 90%.  Patient says her breathing is improving, able to take deeper breaths.  Much less coughing.  No fevers or chills.  She denies other acute complaints including headache, N/V/D.  Assessment & Plan:   Principal Problem:   Pneumonia due to COVID-19 virus Active Problems:   Acute respiratory failure with hypoxia (HCC)   Migraine headache   Acute respiratory failure with hypoxia secondary to COVID-19 pneumonia Patient presented with fever, tachypnea, tachycardia, hypoxia and worsening shortness of breath. Chest x-ray with multifocal  infiltrates,consistent with COVID-19 pneumonia. Covid test was done in the outpatient setting on Friday, returned positive on Saturday, per patient. -finished Remdesivir 12/21 -continue Decadron IV- day5 -Vitamin C and zinc -Supplemental oxygen to maintain O2 sat>90%, wean off as tolerated -Antitussives, antipyretics, inhaler -Increase albuterol inhaler to every 2 hours as needed -Maintain airborne and contact precautions -Monitor inflammatory markers daily  History of migraine headaches-with active headache on admission. Not on prescription medications at home. Not relieved by Tylenol previously given. -Fioricet as needed   DVT prophylaxis: Lovenox   Code Status: Full Code  Family Communication: none at bedside  Disposition Plan:  Hope to d/c home tomorrow, if weaned off oxygen.   Consultants:   None  Procedures:   None  Antimicrobials:   None    Objective: Vitals:   05/15/19 1738 05/15/19 2007 05/16/19 0642 05/16/19 1211  BP: (!) 108/59 (!) 104/58 (!) 88/46 (!) 105/59  Pulse: 70 70 67 76  Resp: 19     Temp: 98.4 F (36.9 C) 98 F (36.7 C) 97.8 F (36.6 C) 98.3 F (36.8 C)  TempSrc: Oral Oral    SpO2:  93% 95% 96%  Weight:      Height:        Intake/Output Summary (Last 24 hours) at 05/16/2019 1416 Last data filed at 05/16/2019 0912 Gross per 24 hour  Intake 3 ml  Output --  Net 3 ml   Filed Weights   05/11/19 1521  Weight: 86.2 kg    Examination:  General exam: awake, alert, no acute distress HEENT: moist mucus membranes, hearing grossly normal  Respiratory system: better air movement but still diminished, minimal expiratory wheeze today, left base rhonchi improved, normal  respiratory effort. Cardiovascular system: normal S1/S2, RRR, no JVD, murmurs, rubs, gallops, no pedal edema.   Central nervous system: alert and oriented x4. no gross focal neurologic deficits, normal speech Extremities: moves all, no edema, normal tone Skin: dry,  intact, normal temperature Psychiatry: normal mood, congruent affect, judgement and insight appear normal    Data Reviewed: I have personally reviewed following labs and imaging studies  CBC: Recent Labs  Lab 05/12/19 0402 05/13/19 0551 05/14/19 0558 05/15/19 0441 05/16/19 0604  WBC 3.5* 10.0 5.0 6.5 8.6  NEUTROABS 2.9 8.2* 3.1 4.3 5.5  HGB 10.5* 11.3* 10.9* 10.7* 11.0*  HCT 32.1* 35.7* 33.8* 32.5* 33.1*  MCV 76.6* 79.5* 79.7* 76.5* 76.4*  PLT 190 236 242 263 297   Basic Metabolic Panel: Recent Labs  Lab 05/12/19 0402 05/12/19 2220 05/13/19 0551 05/14/19 0558 05/15/19 0441 05/16/19 0604  NA 139 139 137 140 139 140  K 3.4* 3.2* 3.5 3.5 3.5 3.7  CL 107 107 103 103 102 103  CO2 21* 22 24 27 27 23   GLUCOSE 208* 178* 117* 108* 112* 103*  BUN 10 8 11 13 15 16   CREATININE 0.63 0.56 0.62 0.42* 0.40* 0.41*  CALCIUM 7.6* 7.8* 7.8* 7.9* 8.3* 8.3*  MG 1.9 1.8 1.7 2.0 1.9 2.0  PHOS 2.1*  --  1.6* 3.2 3.8 3.5   GFR: Estimated Creatinine Clearance: 94.2 mL/min (A) (by C-G formula based on SCr of 0.41 mg/dL (L)). Liver Function Tests: Recent Labs  Lab 05/12/19 0402 05/13/19 0551 05/14/19 0558 05/15/19 0441 05/16/19 0604  AST 31 28 86* 27 17  ALT 24 24 103* 69* 48*  ALKPHOS 46 47 47 43 40  BILITOT 0.5 0.6 0.7 0.6 0.6  PROT 7.0 7.3 6.8 6.6 6.5  ALBUMIN 3.2* 3.6 3.3* 3.3* 3.1*   No results for input(s): LIPASE, AMYLASE in the last 168 hours. No results for input(s): AMMONIA in the last 168 hours. Coagulation Profile: No results for input(s): INR, PROTIME in the last 168 hours. Cardiac Enzymes: No results for input(s): CKTOTAL, CKMB, CKMBINDEX, TROPONINI in the last 168 hours. BNP (last 3 results) No results for input(s): PROBNP in the last 8760 hours. HbA1C: No results for input(s): HGBA1C in the last 72 hours. CBG: No results for input(s): GLUCAP in the last 168 hours. Lipid Profile: No results for input(s): CHOL, HDL, LDLCALC, TRIG, CHOLHDL, LDLDIRECT in the  last 72 hours. Thyroid Function Tests: No results for input(s): TSH, T4TOTAL, FREET4, T3FREE, THYROIDAB in the last 72 hours. Anemia Panel: Recent Labs    05/15/19 0441 05/16/19 0604  FERRITIN 56 50   Sepsis Labs: Recent Labs  Lab 05/11/19 1737 05/12/19 2220 05/13/19 1317 05/13/19 1653  PROCALCITON <0.10  --   --   --   LATICACIDVEN 2.8* 3.3* 3.7* 2.4*    No results found for this or any previous visit (from the past 240 hour(s)).       Radiology Studies: No results found.      Scheduled Meds: . vitamin C  500 mg Oral Daily  . dexamethasone (DECADRON) injection  6 mg Intravenous Q24H  . enoxaparin (LOVENOX) injection  40 mg Subcutaneous Q24H  . ipratropium  2 puff Inhalation Q4H  . magic mouthwash  5 mL Oral QID  . sodium chloride flush  3 mL Intravenous Q12H  . zinc sulfate  220 mg Oral Daily   Continuous Infusions: . sodium chloride 10 mL (05/15/19 0913)  . sodium chloride       LOS: 5  days    Time spent: 30-35 minutes    Pennie BanterKelly A Ericson Nafziger, DO [facility]   If 7PM-7AM, please contact night-coverage [email or web address] Password [email or web address] 05/16/2019, 2:16 PM

## 2019-05-17 MED ORDER — ALBUTEROL SULFATE HFA 108 (90 BASE) MCG/ACT IN AERS: 2.0000 | INHALATION_SPRAY | RESPIRATORY_TRACT | 0 refills | Status: DC | PRN

## 2019-05-17 MED ORDER — DEXAMETHASONE 6 MG PO TABS: 6.0000 mg | ORAL_TABLET | Freq: Every day | ORAL | 0 refills | Status: AC

## 2019-05-18 NOTE — Discharge Summary (Signed)
Physician Discharge Summary  Amanda Weeks [id number] DOB: 1975-08-02 DOA: 05/11/2019  PCP: Patient, No Pcp Per  Admit date: 05/11/2019 Discharge date: 05/18/2019  Admitted From: Home Disposition:  Home  Recommendations for Outpatient Follow-up:  1. Follow up with PCP in 1-2 weeks 2. Please obtain BMP/CBC in one week  Home Health: No  Equipment/Devices: None   Discharge Condition: Stable  CODE STATUS: Full  Diet recommendation: Regular   Brief/Interim Summary:  43 y.o.femalewith medical history significant ofmigraines presented to the [facility] 12/17 withworsening shortness of breath and fevers.Onset of symptoms 5 dayspriorincluding fever, myalgias, nasal congestion, cough and shortness of breath.Had been Covid tested and received positive result on Saturday PTA. Had been doing okay at home, but started to notice her O2 sat dropping into 80's at home. In the ED, she was initially afebrile, but spiked fever to 102.8, tachycardic, tachypneic, hypoxic at 88% on room air improved on 2 L/min oxygen.Labs were notable for lactic acid 2.8, no leukocytosis, potassium 3.0Chest x-ray showed bilateral pulmonary infiltrates most significant at the bases. She was treated with Tylenol, albuterol neb, Solu-Medrol 125 mg, and 1 L fluid bolus. Admitted for management of acute hypoxic respiratory failure secondary to pneumonia due to COVID-19. Being Treated with remdesivir and dexamethasone, in addition to vitamin C and zinc, supplemental oxygen.  Treated with 5 day course Remdesivir, steroids were continued and not prescribed on discharge.Did require 1-2 L/min O2 throughout admission but was able to wean off and maintain O2 sats on room air prior to discharge.  She agrees to monitor her pulse ox at home.  Patient clinically improved and stable for discharge home.  Patient advised to self-isolate, and to call doctor or come to the ED if hypoxia recurs or any symptoms worsen.        Discharge Diagnoses: Principal Problem:   Pneumonia due to COVID-19 virus Active Problems:   Acute respiratory failure with hypoxia [facility])   Migraine headache    Discharge Instructions   Discharge Instructions    MyChart COVID-19 home monitoring program   Complete by: May 17, 2019    Is the patient willing to use the [place] for home monitoring?: Yes   Temperature monitoring   Complete by: May 17, 2019    After how many days would you like to receive a notification of this patient's flowsheet entries?: 1   Call MD for:   Complete by: As directed    Worsening shortness of breath or oxygen desaturation below 90% and not improving with rest.   Call MD for:  temperature >100.4   Complete by: As directed    Diet - low sodium heart healthy   Complete by: As directed    Discharge instructions   Complete by: As directed    Monitor oxygen saturation with exertion.  Increase physical activity as tolerated, so long as oxygen saturation stays 90% or higher.    Take dexamethasone for 4 more days.    Use albuterol inhaler as needed if wheezing.   Increase activity slowly   Complete by: As directed      Allergies as of 05/17/2019   No Known Allergies     Medication List    TAKE these medications   acetaminophen 500 MG tablet Commonly known as: TYLENOL Take 1 tablet (500 mg total) by mouth every 6 (six) hours as needed.   albuterol 108 (90 Base) MCG/ACT inhaler Commonly known as: VENTOLIN HFA Inhale 2 puffs into the lungs every 4 (four) hours  as needed for wheezing or shortness of breath.   cholecalciferol 25 MCG (1000 UT) tablet Commonly known as: VITAMIN D3 Take 1,000 Units by mouth daily.   dexamethasone 6 MG tablet Commonly known as: Decadron Take 1 tablet (6 mg total) by mouth daily for 4 days.   vitamin C 250 MG tablet Commonly known as: ASCORBIC ACID Take 250 mg by mouth daily.   zinc gluconate 50 MG tablet Take 50 mg by mouth daily.        No Known Allergies  Consultations:  None    Procedures/Studies: DG Chest 2 View  Result Date: 05/11/2019 CLINICAL DATA:  COVID-19 positive.  Shortness of breath. EXAM: CHEST - 2 VIEW COMPARISON:  June 28, 2018 FINDINGS: Bilateral pulmonary infiltrates, particularly in the bases, are consistent with the patient's COVID-19 status. No pneumothorax. The cardiomediastinal silhouette is normal. No other acute abnormalities. IMPRESSION: Bilateral pulmonary infiltrates, particularly in the bases, consistent with the reported history of COVID-19 positive status and shortness of breath. Electronically Signed   By: Gerome Sam III M.D   On: 05/11/2019 15:49   DG Chest Port 1 View  Result Date: 05/12/2019 CLINICAL DATA:  Hypoxia. COVID-19. EXAM: PORTABLE CHEST 1 VIEW COMPARISON:  Chest x-rays dated 05/11/2019 and 06/28/2018 FINDINGS: The bilateral pulmonary infiltrates have appreciably progressed since the prior study. Heart size and vascularity remain normal. No effusions. No significant bone abnormality. IMPRESSION: Progressive bilateral pulmonary infiltrates since the prior study. This is consistent with COVID-19 pneumonia. Electronically Signed   By: Francene Boyers M.D.   On: 05/12/2019 21:36       Subjective: Patient says she feels a lot better.  Has had oxygen off and monitors pulse ox walking around room, now staying above 90%.  No fever/chills.  Cough nearly resolved. Requests to go home.   Discharge Exam: Vitals:   05/17/19 0922 05/17/19 1225  BP:  95/63  Pulse:  77  Resp:  15  Temp:  98.2 F (36.8 C)  SpO2: 93% 94%   Vitals:   05/16/19 1211 05/17/19 0526 05/17/19 0922 05/17/19 1225  BP: (!) 105/59 (!) 104/52  95/63  Pulse: 76 72  77  Resp:    15  Temp: 98.3 F (36.8 C) 98 F (36.7 C)  98.2 F (36.8 C)  TempSrc:    Oral  SpO2: 96% 94% 93% 94%  Weight:      Height:        General: Pt is alert, awake, not in acute distress Cardiovascular: left base crackles  otherwise clear and less diminished, S1/S2 +, no rubs, no gallops Respiratory: CTA bilaterally, no wheezing, no rhonchi Abdominal: Soft, NT, ND, bowel sounds + Extremities: no edema, no cyanosis    The results of significant diagnostics from this hospitalization (including imaging, microbiology, ancillary and laboratory) are listed below for reference.     Microbiology: No results found for this or any previous visit (from the past 240 hour(s)).   Labs: BNP (last 3 results) Recent Labs    05/13/19 0551  BNP 47.0   Basic Metabolic Panel: Recent Labs  Lab 05/12/19 0402 05/12/19 2220 05/13/19 0551 05/14/19 0558 05/15/19 0441 05/16/19 0604  NA 139 139 137 140 139 140  K 3.4* 3.2* 3.5 3.5 3.5 3.7  CL 107 107 103 103 102 103  CO2 21* 22 24 27 27 23   GLUCOSE 208* 178* 117* 108* 112* 103*  BUN 10 8 11 13 15 16   CREATININE 0.63 0.56 0.62 0.42* 0.40* 0.41*  CALCIUM  7.6* 7.8* 7.8* 7.9* 8.3* 8.3*  MG 1.9 1.8 1.7 2.0 1.9 2.0  PHOS 2.1*  --  1.6* 3.2 3.8 3.5   Liver Function Tests: Recent Labs  Lab 05/12/19 0402 05/13/19 0551 05/14/19 0558 05/15/19 0441 05/16/19 0604  AST 31 28 86* 27 17  ALT 24 24 103* 69* 48*  ALKPHOS 46 47 47 43 40  BILITOT 0.5 0.6 0.7 0.6 0.6  PROT 7.0 7.3 6.8 6.6 6.5  ALBUMIN 3.2* 3.6 3.3* 3.3* 3.1*   No results for input(s): LIPASE, AMYLASE in the last 168 hours. No results for input(s): AMMONIA in the last 168 hours. CBC: Recent Labs  Lab 05/12/19 0402 05/13/19 0551 05/14/19 0558 05/15/19 0441 05/16/19 0604  WBC 3.5* 10.0 5.0 6.5 8.6  NEUTROABS 2.9 8.2* 3.1 4.3 5.5  HGB 10.5* 11.3* 10.9* 10.7* 11.0*  HCT 32.1* 35.7* 33.8* 32.5* 33.1*  MCV 76.6* 79.5* 79.7* 76.5* 76.4*  PLT 190 236 242 263 297   Cardiac Enzymes: No results for input(s): CKTOTAL, CKMB, CKMBINDEX, TROPONINI in the last 168 hours. BNP: Invalid input(s): POCBNP CBG: No results for input(s): GLUCAP in the last 168 hours. D-Dimer No results for input(s): DDIMER in the  last 72 hours. Hgb A1c No results for input(s): HGBA1C in the last 72 hours. Lipid Profile No results for input(s): CHOL, HDL, LDLCALC, TRIG, CHOLHDL, LDLDIRECT in the last 72 hours. Thyroid function studies No results for input(s): TSH, T4TOTAL, T3FREE, THYROIDAB in the last 72 hours.  Invalid input(s): FREET3 Anemia work up [facility] [facility]    05/16/19 0604  FERRITIN 50   Urinalysis No results found for: COLORURINE, APPEARANCEUR, LABSPEC, PHURINE, GLUCOSEU, HGBUR, BILIRUBINUR, KETONESUR, PROTEINUR, UROBILINOGEN, NITRITE, LEUKOCYTESUR Sepsis Labs Invalid input(s): PROCALCITONIN,  WBC,  LACTICIDVEN Microbiology No results found for this or any previous visit (from the past 240 hour(s)).   Time coordinating discharge: Over 30 minutes  SIGNED:   Pennie BanterKelly A Frederic Tones, DO [facility] 05/18/2019, 5:51 PM   If 7PM-7AM, please contact night-coverage [email or web address] Password [email or web address]

## 2019-09-05 ENCOUNTER — Other Ambulatory Visit: Payer: Self-pay | Admitting: Gerontology

## 2019-09-05 DIAGNOSIS — Z1231 Encounter for screening mammogram for malignant neoplasm of breast: Secondary | ICD-10-CM

## 2019-09-05 LAB — HM PAP SMEAR

## 2019-09-06 DIAGNOSIS — R7303 Prediabetes: Secondary | ICD-10-CM | POA: Insufficient documentation

## 2019-09-06 DIAGNOSIS — E538 Deficiency of other specified B group vitamins: Secondary | ICD-10-CM | POA: Insufficient documentation

## 2019-09-06 DIAGNOSIS — E559 Vitamin D deficiency, unspecified: Secondary | ICD-10-CM | POA: Insufficient documentation

## 2019-09-06 DIAGNOSIS — R7989 Other specified abnormal findings of blood chemistry: Secondary | ICD-10-CM | POA: Insufficient documentation

## 2019-09-06 DIAGNOSIS — E063 Autoimmune thyroiditis: Secondary | ICD-10-CM | POA: Insufficient documentation

## 2019-11-28 ENCOUNTER — Emergency Department
Admission: EM | Admit: 2019-11-28 | Discharge: 2019-11-28 | Disposition: A | Discharge status: Home / Self Care | Payer: 59 | Attending: Emergency Medicine | Admitting: Emergency Medicine

## 2019-11-28 ENCOUNTER — Encounter: Payer: Self-pay | Admitting: Emergency Medicine

## 2019-11-28 ENCOUNTER — Other Ambulatory Visit: Payer: Self-pay

## 2019-11-28 DIAGNOSIS — G43009 Migraine without aura, not intractable, without status migrainosus: Secondary | ICD-10-CM

## 2019-11-28 DIAGNOSIS — G43909 Migraine, unspecified, not intractable, without status migrainosus: Secondary | ICD-10-CM | POA: Insufficient documentation

## 2019-11-28 MED ORDER — KETOROLAC TROMETHAMINE 30 MG/ML IJ SOLN
30.0000 mg | Freq: Once | INTRAMUSCULAR | Status: AC
  Administered 2019-11-28: 30 mg via INTRAVENOUS
  Filled 2019-11-28: qty 1

## 2019-11-28 MED ORDER — SODIUM CHLORIDE 0.9 % IV SOLN: Freq: Once | INTRAVENOUS | Status: AC

## 2019-11-28 MED ORDER — METOCLOPRAMIDE HCL 5 MG/ML IJ SOLN
10.0000 mg | Freq: Once | INTRAMUSCULAR | Status: AC
  Administered 2019-11-28: 10 mg via INTRAVENOUS
  Filled 2019-11-28: qty 2

## 2019-11-28 MED ORDER — BUTALBITAL-APAP-CAFFEINE 50-325-40 MG PO TABS: 1.0000 | ORAL_TABLET | Freq: Four times a day (QID) | ORAL | 0 refills | Status: AC | PRN

## 2019-11-28 MED ORDER — DIPHENHYDRAMINE HCL 50 MG/ML IJ SOLN
25.0000 mg | Freq: Once | INTRAMUSCULAR | Status: AC
  Administered 2019-11-28: 25 mg via INTRAVENOUS
  Filled 2019-11-28: qty 1

## 2019-11-28 NOTE — ED Notes (Signed)
See triage note, pt reports migraine since Friday, hx of migraine.  Reports taking tramadol with no relief.  Speech clear

## 2019-11-28 NOTE — ED Notes (Signed)
Pt provided warm blanket.

## 2019-11-28 NOTE — ED Notes (Signed)
This RN at bedside. Pt HR jumped up to 130s for 30secs. Pt stating her HR does that and pt denies any symptoms. MD made aware.

## 2019-11-28 NOTE — ED Triage Notes (Signed)
Patient to ER  For c/o migraine since Friday. Patient reports h/o migraines, states this feels like typical migraine. Patient reports she has tried several OTC medications without relief. Patient states headaches are not frequent enough to be prescribed preventative medications (tried Topirimate in the past with issues with tachycardia).

## 2019-11-28 NOTE — ED Provider Notes (Signed)
  ER Provider Note       Time seen: 5:32 PM    I have reviewed the vital signs and the nursing notes.  HISTORY   Chief Complaint Migraine    HPI Amanda Weeks is a 44 y.o. female with a history of migraines who presents today for migraine headache since Friday.  Patient reports this is similar to her typical migraine.  She is tried over the counter medications without significant improvement.  Pain is 10 out of 10.  Past Medical History:  Diagnosis Date  . Migraines     Past Surgical History:  Procedure Laterality Date  . CESAREAN SECTION      Allergies Tizanidine  Review of Systems Constitutional: Negative for fever. Cardiovascular: Negative for chest pain. Respiratory: Negative for shortness of breath. Gastrointestinal: Negative for abdominal pain, vomiting and diarrhea. Musculoskeletal: Negative for back pain. Skin: Negative for rash. Neurological: Positive for headache  All systems negative/normal/unremarkable except as stated in the HPI  ____________________________________________   PHYSICAL EXAM:  VITAL SIGNS: Vitals:   11/28/19 1415 11/28/19 1730  BP: (!) 136/92 128/84  Pulse: 93 78  Resp: 20 17  Temp: 98.1 F (36.7 C)   SpO2: 98% 100%    Constitutional: Alert and oriented. Well appearing and in no distress. Eyes: Conjunctivae are normal. Normal extraocular movements. ENT      Head: Normocephalic and atraumatic.      Nose: No congestion/rhinnorhea.      Mouth/Throat: Mucous membranes are moist.      Neck: No stridor. Cardiovascular: Normal rate, regular rhythm. No murmurs, rubs, or gallops. Respiratory: Normal respiratory effort without tachypnea nor retractions. Breath sounds are clear and equal bilaterally. No wheezes/rales/rhonchi. Gastrointestinal: Soft and nontender. Normal bowel sounds Musculoskeletal: Nontender with normal range of motion in extremities. No lower extremity tenderness nor edema. Neurologic:  Normal speech and  language. No gross focal neurologic deficits are appreciated.  Skin:  Skin is warm, dry and intact. No rash noted. Psychiatric: Speech and behavior are normal.   DIFFERENTIAL DIAGNOSIS  Migraine, tension headache, dehydration  ASSESSMENT AND PLAN  Migraine headache   Plan: The patient had presented for her typical migraine headache.  She was given IV headache cocktail with improvement in her symptoms.  She is cleared for outpatient follow-up.  Daryel November MD    Note: This note was generated in part or whole with voice recognition software. Voice recognition is usually quite accurate but there are transcription errors that can and very often do occur. I apologize for any typographical errors that were not detected and corrected.     Emily Filbert, MD 11/28/19 [phone]

## 2021-02-03 ENCOUNTER — Emergency Department: Payer: 59

## 2021-02-03 ENCOUNTER — Emergency Department
Admission: EM | Admit: 2021-02-03 | Discharge: 2021-02-03 | Disposition: A | Discharge status: Home / Self Care | Payer: 59 | Attending: Emergency Medicine | Admitting: Emergency Medicine

## 2021-02-03 ENCOUNTER — Other Ambulatory Visit: Payer: Self-pay

## 2021-02-03 DIAGNOSIS — J209 Acute bronchitis, unspecified: Secondary | ICD-10-CM | POA: Insufficient documentation

## 2021-02-03 DIAGNOSIS — R0981 Nasal congestion: Secondary | ICD-10-CM | POA: Insufficient documentation

## 2021-02-03 MED ORDER — BENZONATATE 100 MG PO CAPS: 100.0000 mg | ORAL_CAPSULE | Freq: Three times a day (TID) | ORAL | 0 refills | Status: AC | PRN

## 2021-02-03 MED ORDER — ALBUTEROL SULFATE HFA 108 (90 BASE) MCG/ACT IN AERS: 2.0000 | INHALATION_SPRAY | RESPIRATORY_TRACT | 0 refills | Status: DC | PRN

## 2021-02-03 MED ORDER — PREDNISONE 20 MG PO TABS: 40.0000 mg | ORAL_TABLET | Freq: Every day | ORAL | 0 refills | Status: AC

## 2021-02-03 MED ORDER — BENZONATATE 100 MG PO CAPS
200.0000 mg | ORAL_CAPSULE | Freq: Once | ORAL | Status: AC
  Administered 2021-02-03: 200 mg via ORAL
  Filled 2021-02-03: qty 2

## 2021-02-03 MED ORDER — NAPROXEN 500 MG PO TABS: 500.0000 mg | ORAL_TABLET | Freq: Two times a day (BID) | ORAL | 0 refills | Status: DC

## 2021-02-03 MED ORDER — PREDNISONE 20 MG PO TABS
40.0000 mg | ORAL_TABLET | ORAL | Status: AC
  Administered 2021-02-03: 40 mg via ORAL
  Filled 2021-02-03: qty 2

## 2021-02-03 MED ORDER — IPRATROPIUM-ALBUTEROL 0.5-2.5 (3) MG/3ML IN SOLN
3.0000 mL | Freq: Once | RESPIRATORY_TRACT | Status: AC
  Administered 2021-02-03: 3 mL via RESPIRATORY_TRACT
  Filled 2021-02-03: qty 3

## 2021-02-03 NOTE — ED Provider Notes (Signed)
[facility] Emergency Department Provider Note  ____________________________________________  Time seen: Approximately 7:26 AM  I have reviewed the triage vital signs and the nursing notes.   HISTORY  Chief Complaint Shortness of Breath    HPI Amanda Weeks is a 45 y.o. female with a past history of migraines who complains of nonproductive cough and nasal congestion for the past 10 days.  No fever.  Eating and drinking normally.  Over last 24 hours feels more congested, feels short of breath with lying flat.  No peripheral edema.  Symptoms are constant, no aggravating or alleviating factors.  Reports having received COVID vaccination series and booster.  No significant health issues, no diabetes or hypertension.    Past Medical History:  Diagnosis Date  . Migraines      Patient Active Problem List   Diagnosis Date Noted  . Pneumonia due to COVID-19 virus 05/11/2019  . Acute respiratory failure with hypoxia (HCC) 05/11/2019  . Migraine headache 05/11/2019     Past Surgical History:  Procedure Laterality Date  . CESAREAN SECTION       Prior to Admission medications   Medication Sig Start Date End Date Taking? Authorizing Provider  albuterol (PROVENTIL HFA) 108 (90 Base) MCG/ACT inhaler Inhale 2 puffs into the lungs every 4 (four) hours as needed for wheezing or shortness of breath. 02/03/21  Yes Sharman Cheek, MD  benzonatate (TESSALON PERLES) 100 MG capsule Take 1 capsule (100 mg total) by mouth 3 (three) times daily as needed for cough. 02/03/21 02/03/22 Yes Sharman Cheek, MD  naproxen (NAPROSYN) 500 MG tablet Take 1 tablet (500 mg total) by mouth 2 (two) times daily with a meal. 02/03/21  Yes Sharman Cheek, MD  predniSONE (DELTASONE) 20 MG tablet Take 2 tablets (40 mg total) by mouth daily with breakfast for 4 days. 02/03/21 02/07/21 Yes Sharman Cheek, MD  acetaminophen (TYLENOL) 500 MG tablet Take 1 tablet (500 mg total) by  mouth every 6 (six) hours as needed. 06/28/18   Enid Derry, PA-C  cholecalciferol (VITAMIN D3) 25 MCG (1000 UT) tablet Take 1,000 Units by mouth daily.    [provider]  vitamin C (ASCORBIC ACID) 250 MG tablet Take 250 mg by mouth daily.    [provider]  zinc gluconate 50 MG tablet Take 50 mg by mouth daily.    [provider]     Allergies Tizanidine   No family history on file.  Social History Social History   Tobacco Use  . Smoking status: Never  . Smokeless tobacco: Never  Substance Use Topics  . Alcohol use: Yes    Alcohol/week: 0.0 standard drinks    Comment: Occasional  . Drug use: Never    Review of Systems  Constitutional:   No fever or chills.  ENT:   Positive sore throat.  Positive rhinorrhea. Cardiovascular:   No chest pain or syncope. Respiratory:   Positive shortness of breath and nonproductive cough. Gastrointestinal:   Negative for abdominal pain, vomiting and diarrhea.  Musculoskeletal:   Negative for focal pain or swelling All other systems reviewed and are negative except as documented above in ROS and HPI.  ____________________________________________   PHYSICAL EXAM:  VITAL SIGNS: ED Triage Vitals  Enc Vitals Group     BP 02/03/21 0702 134/71     Pulse Rate 02/03/21 0702 90     Resp 02/03/21 0702 (!) 26     Temp 02/03/21 0702 99 F (37.2 C)     Temp  Source 02/03/21 0702 Oral     SpO2 02/03/21 0702 97 %     Weight --      Height --      Head Circumference --      Peak Flow --      Pain Score 02/03/21 0712 4     Pain Loc --      Pain Edu? --      Excl. in GC? --     Vital signs reviewed, nursing assessments reviewed.   Constitutional:   Alert and oriented. Non-toxic appearance. Eyes:   Conjunctivae are normal. EOMI. PERRL. ENT      Head:   Normocephalic and atraumatic.      Nose:   Wearing a mask.      Mouth/Throat:   Wearing a mask.      Neck:   No meningismus. Full  ROM. Hematological/Lymphatic/Immunilogical:   No cervical lymphadenopathy. Cardiovascular:   RRR.   No murmurs. Cap refill less than 2 seconds. Respiratory:   Normal respiratory effort without tachypnea/retractions.  There is mild and expiratory wheezing, and FEV1 maneuver provokes cough, prolonged expiratory phase and accentuates wheezing. Gastrointestinal:   Soft and nontender.   Musculoskeletal:   Normal range of motion in all extremities. No joint effusions.  No lower extremity tenderness.  No edema. Neurologic:   Normal speech and language.  Motor grossly intact. No acute focal neurologic deficits are appreciated.  Skin:    Skin is warm, dry and intact. No rash noted.  No petechiae, purpura, or bullae.  ____________________________________________    LABS (pertinent positives/negatives) (all labs ordered are listed, but only abnormal results are displayed) Labs Reviewed - No data to display ____________________________________________   EKG  Interpreted by me  Date: 02/03/2021  Rate: 86  Rhythm: normal sinus rhythm  QRS Axis: normal  Intervals: normal  ST/T Wave abnormalities: normal  Conduction Disutrbances: none  Narrative Interpretation: unremarkable     ____________________________________________    RADIOLOGY  No results found.  ____________________________________________   PROCEDURES Procedures  ____________________________________________    CLINICAL IMPRESSION / ASSESSMENT AND PLAN / ED COURSE  Medications ordered in the ED: Medications  predniSONE (DELTASONE) tablet 40 mg (has no administration in time range)  ipratropium-albuterol (DUONEB) 0.5-2.5 (3) MG/3ML nebulizer solution 3 mL (has no administration in time range)  benzonatate (TESSALON) capsule 200 mg (has no administration in time range)    Pertinent labs & imaging results that were available during my care of the patient were reviewed by me and considered in my medical decision  making (see chart for details).  Amanda Weeks was evaluated in Emergency Department on 02/03/2021 for the symptoms described in the history of present illness. She was evaluated in the context of the global COVID-19 pandemic, which necessitated consideration that the patient might be at risk for infection with the SARS-CoV-2 virus that causes COVID-19. Institutional protocols and algorithms that pertain to the evaluation of patients at risk for COVID-19 are in a state of rapid change based on information released by regulatory bodies including the CDC and federal and state organizations. These policies and algorithms were followed during the patient's care in the ED.   Patient presents with shortness of breath rhinorrhea nonproductive cough, symptoms and exam highly consistent with viral illness and acute bronchitis.  Vital signs are unremarkable, no respiratory distress.  She is already completed home COVID antigen testing as well vaccinated and does not have high risk comorbidities.  No further testing needed.  We will  treat supportively with prednisone, bronchodilators, cough medicine.  Highly doubt ACS PE dissection carditis.  Will obtain chest x-ray to look for superimposed pneumonia or pneumothorax.  Otherwise, stable for discharge.  Clinical Course as of 02/03/21 0755  Sun Feb 03, 2021  2297 Chest x-ray unremarkable. [PS]    Clinical Course User Index [PS] Sharman Cheek, MD     ____________________________________________   FINAL CLINICAL IMPRESSION(S) / ED DIAGNOSES    Final diagnoses:  Acute bronchitis, unspecified organism     ED Discharge Orders          Ordered    albuterol (PROVENTIL HFA) 108 (90 Base) MCG/ACT inhaler  Every 4 hours PRN        02/03/21 0726    predniSONE (DELTASONE) 20 MG tablet  Daily with breakfast        02/03/21 0726    benzonatate (TESSALON PERLES) 100 MG capsule  3 times daily PRN        02/03/21 0726    naproxen (NAPROSYN) 500 MG  tablet  2 times daily with meals        02/03/21 0726            Portions of this note were generated with dragon dictation software. Dictation errors may occur despite best attempts at proofreading.    Sharman Cheek, MD 02/03/21 [phone]

## 2021-02-03 NOTE — ED Triage Notes (Signed)
First RN Note: pt to ED via POV with c/o cough, SOB, fever. Pt ambulatory to triage desk at this time. Pt A&O x4 upon arrival to triage desk.

## 2021-02-03 NOTE — ED Triage Notes (Signed)
Pt come with c/o SOB since yesterday and nonproductive cough for 2 weeks. Pt states more trouble breathing when laying down. Pt tested neg for covid with at home test.  Pt states runny nose and sore throat.

## 2021-02-03 NOTE — ED Notes (Signed)
Pt to ED POV c/o SOB on exertion that has gotten worse since yesterday.Cough started earlier this week, with no phlegm. Pt states she tested mid week for COVID and it was negative.  Denies CP. Pt states she has taken robitussin, tylenol, and sinus medication at home with no relief.  A&O x4, NAD

## 2021-07-24 ENCOUNTER — Ambulatory Visit: Payer: Self-pay

## 2021-07-24 NOTE — Telephone Encounter (Signed)
Second attempt to reach pt, message left.

## 2021-07-24 NOTE — Telephone Encounter (Signed)
Patient has had chronic head aches and there worsening, patient has a NPA in September seeking clinical advice prior to appointment  ? ?Left message to call back about symptoms. ?

## 2021-07-24 NOTE — Telephone Encounter (Signed)
?  Chief Complaint: requesting new patient appt sooner than 02/06/22 and requesting referral to specialist  ?Symptoms: chronic migraine headaches , not now. Causes nausea, severe headache, unable to do normal activities until she sleeps them off.  ?Frequency: occurring every month now  ?Pertinent Negatives: Patient denies headache now.  ?Disposition: [] ED /[] Urgent Care (no appt availability in office) / [] Appointment(In office/virtual)/ []  [facility]/ [] Home Care/ [] Refused Recommended Disposition /[] [facility]/ [x]  Follow-up with PCP ?Additional Notes:  ? ?Recommended to go to ED if headache occurs with no relief in 2 hours after pain medication. Please advise if patient can get new patient appt earlier than 02/06/22. Patient on wait list . ? ? Reason for Disposition ? Headache is a chronic symptom (recurrent or ongoing AND present > 4 weeks) ? ?Answer Assessment - Initial Assessment Questions ?1. LOCATION: "Where does it hurt?"  ?    Not hurting now  ?2. ONSET: "When did the headache start?" (Minutes, hours or days)  ?    Has had migraine headaches since [age] years of age ?3. PATTERN: "Does the pain come and go, or has it been constant since it started?" ?    Ma  ?4. SEVERITY: "How bad is the pain?" and "What does it keep you from doing?"  (e.g., Scale 1-10; mild, moderate, or severe) ?  - MILD (1-3): doesn't interfere with normal activities  ?  - MODERATE (4-7): interferes with normal activities or awakens from sleep  ?  - SEVERE (8-10): excruciating pain, unable to do any normal activities    ?    When headache starts unable to do normal activities and needs to sleep  ?5. RECURRENT SYMPTOM: "Have you ever had headaches before?" If Yes, ask: "When was the last time?" and "What happened that time?"  ?    na ?6. CAUSE: "What do you think is causing the headache?" ?    na ?7. MIGRAINE: "Have you been diagnosed with migraine headaches?" If Yes, ask: "Is this headache similar?"  ?    Yes  ?8.  HEAD INJURY: "Has there been any recent injury to the head?"  ?    na ?9. OTHER SYMPTOMS: "Do you have any other symptoms?" (fever, stiff neck, eye pain, sore throat, cold symptoms) ?    na ?10. PREGNANCY: "Is there any chance you are pregnant?" "When was your last menstrual period?" ?      na ? ?Protocols used: Headache-A-AH ? ?

## 2021-09-17 ENCOUNTER — Ambulatory Visit: Payer: Self-pay | Admitting: Nurse Practitioner

## 2022-01-18 ENCOUNTER — Encounter: Payer: Self-pay | Admitting: Nurse Practitioner

## 2022-02-02 NOTE — Patient Instructions (Addendum)
Magnesium 400 MG before bedtime  Please call to schedule your mammogram and/or bone density: [facility] at [place]: [address], [place], [place] [address] Phone: [phone]  [place] [facility] [address] [place],  [place]  [address] Phone: [phone]    Healthy Eating Following a healthy eating pattern may help you to achieve and maintain a healthy body weight, reduce the risk of chronic disease, and live a long and productive life. It is important to follow a healthy eating pattern at an appropriate calorie level for your body. Your nutritional needs should be met primarily through food by choosing a variety of nutrient-rich foods. What are tips for following this plan? Reading food labels Read labels and choose the following: Reduced or low sodium. Juices with 100% fruit juice. Foods with low saturated fats and high polyunsaturated and monounsaturated fats. Foods with whole grains, such as whole wheat, cracked wheat, brown rice, and wild rice. Whole grains that are fortified with folic acid. This is recommended for women who are pregnant or who want to become pregnant. Read labels and avoid the following: Foods with a lot of added sugars. These include foods that contain brown sugar, corn sweetener, corn syrup, dextrose, fructose, glucose, high-fructose corn syrup, honey, invert sugar, lactose, malt syrup, maltose, molasses, raw sugar, sucrose, trehalose, or turbinado sugar. Do not eat more than the following amounts of added sugar per day: 6 teaspoons (25 g) for women. 9 teaspoons (38 g) for men. Foods that contain processed or refined starches and grains. Refined grain products, such as white flour, degermed cornmeal, white bread, and white rice. Shopping Choose nutrient-rich snacks, such as vegetables, whole fruits, and nuts. Avoid high-calorie and high-sugar snacks, such as potato chips, fruit  snacks, and candy. Use oil-based dressings and spreads on foods instead of solid fats such as butter, stick margarine, or cream cheese. Limit pre-made sauces, mixes, and "instant" products such as flavored rice, instant noodles, and ready-made pasta. Try more plant-protein sources, such as tofu, tempeh, black beans, edamame, lentils, nuts, and seeds. Explore eating plans such as the Mediterranean diet or vegetarian diet. Cooking Use oil to saut or stir-fry foods instead of solid fats such as butter, stick margarine, or lard. Try baking, boiling, grilling, or broiling instead of frying. Remove the fatty part of meats before cooking. Steam vegetables in water or broth. Meal planning  At meals, imagine dividing your plate into fourths: One-half of your plate is fruits and vegetables. One-fourth of your plate is whole grains. One-fourth of your plate is protein, especially lean meats, poultry, eggs, tofu, beans, or nuts. Include low-fat dairy as part of your daily diet. Lifestyle Choose healthy options in all settings, including home, work, school, restaurants, or stores. Prepare your food safely: Wash your hands after handling raw meats. Keep food preparation surfaces clean by regularly washing with hot, soapy water. Keep raw meats separate from ready-to-eat foods, such as fruits and vegetables. Cook seafood, meat, poultry, and eggs to the recommended internal temperature. Store foods at safe temperatures. In general: Keep cold foods at 84F (4.4C) or below. Keep hot foods at 184F (60C) or above. Keep your freezer at [facility] (-17.8C) or below. Foods are no longer safe to eat when they have been between the temperatures of 40-184F (4.4-60C) for more than 2 hours. What foods should I eat? Fruits Aim to eat 2 cup-equivalents of fresh, canned (in natural juice), or frozen fruits each day.  Examples of 1 cup-equivalent of fruit include 1 small apple, 8 large strawberries, 1 cup canned  fruit,  cup dried fruit, or 1 cup 100% juice. Vegetables Aim to eat 2-3 cup-equivalents of fresh and frozen vegetables each day, including different varieties and colors. Examples of 1 cup-equivalent of vegetables include 2 medium carrots, 2 cups raw, leafy greens, 1 cup chopped vegetable (raw or cooked), or 1 medium baked potato. Grains Aim to eat 6 ounce-equivalents of whole grains each day. Examples of 1 ounce-equivalent of grains include 1 slice of bread, 1 cup ready-to-eat cereal, 3 cups popcorn, or  cup cooked rice, pasta, or cereal. Meats and other proteins Aim to eat 5-6 ounce-equivalents of protein each day. Examples of 1 ounce-equivalent of protein include 1 egg, 1/2 cup nuts or seeds, or 1 tablespoon (16 g) peanut butter. A cut of meat or fish that is the size of a deck of cards is about 3-4 ounce-equivalents. Of the protein you eat each week, try to have at least 8 ounces come from seafood. This includes salmon, trout, herring, and anchovies. Dairy Aim to eat 3 cup-equivalents of fat-free or low-fat dairy each day. Examples of 1 cup-equivalent of dairy include 1 cup (240 mL) milk, 8 ounces (250 g) yogurt, 1 ounces (44 g) natural cheese, or 1 cup (240 mL) fortified soy milk. Fats and oils Aim for about 5 teaspoons (21 g) per day. Choose monounsaturated fats, such as canola and olive oils, avocados, peanut butter, and most nuts, or polyunsaturated fats, such as sunflower, corn, and soybean oils, walnuts, pine nuts, sesame seeds, sunflower seeds, and flaxseed. Beverages Aim for six 8-oz glasses of water per day. Limit coffee to three to five 8-oz cups per day. Limit caffeinated beverages that have added calories, such as soda and energy drinks. Limit alcohol intake to no more than 1 drink a day for nonpregnant women and 2 drinks a day for men. One drink equals 12 oz of beer (355 mL), 5 oz of wine (148 mL), or 1 oz of hard liquor (44 mL). Seasoning and other foods Avoid adding excess  amounts of salt to your foods. Try flavoring foods with herbs and spices instead of salt. Avoid adding sugar to foods. Try using oil-based dressings, sauces, and spreads instead of solid fats. This information is based on general [place] nutrition guidelines. For more information, visit [employer]. Exact amounts may vary based on your nutrition needs. Summary A healthy eating plan may help you to maintain a healthy weight, reduce the risk of chronic diseases, and stay active throughout your life. Plan your meals. Make sure you eat the right portions of a variety of nutrient-rich foods. Try baking, boiling, grilling, or broiling instead of frying. Choose healthy options in all settings, including home, work, school, restaurants, or stores. This information is not intended to replace advice given to you by your health care provider. Make sure you discuss any questions you have with your health care provider. Document Revised: 01/08/2021 Document Reviewed: 01/08/2021 Elsevier Patient Education  [place].

## 2022-02-06 ENCOUNTER — Ambulatory Visit (INDEPENDENT_AMBULATORY_CARE_PROVIDER_SITE_OTHER): Payer: No Typology Code available for payment source | Admitting: Nurse Practitioner

## 2022-02-06 ENCOUNTER — Encounter: Payer: Self-pay | Admitting: Nurse Practitioner

## 2022-02-06 VITALS — BP 113/74 | HR 69 | Temp 98.1°F | Ht 62.0 in | Wt 194.4 lb

## 2022-02-06 DIAGNOSIS — R7989 Other specified abnormal findings of blood chemistry: Secondary | ICD-10-CM

## 2022-02-06 DIAGNOSIS — R7303 Prediabetes: Secondary | ICD-10-CM | POA: Diagnosis not present

## 2022-02-06 DIAGNOSIS — E538 Deficiency of other specified B group vitamins: Secondary | ICD-10-CM

## 2022-02-06 DIAGNOSIS — F5101 Primary insomnia: Secondary | ICD-10-CM | POA: Diagnosis not present

## 2022-02-06 DIAGNOSIS — Z7689 Persons encountering health services in other specified circumstances: Secondary | ICD-10-CM

## 2022-02-06 DIAGNOSIS — G4700 Insomnia, unspecified: Secondary | ICD-10-CM | POA: Insufficient documentation

## 2022-02-06 DIAGNOSIS — G43009 Migraine without aura, not intractable, without status migrainosus: Secondary | ICD-10-CM

## 2022-02-06 DIAGNOSIS — Z1159 Encounter for screening for other viral diseases: Secondary | ICD-10-CM

## 2022-02-06 DIAGNOSIS — E669 Obesity, unspecified: Secondary | ICD-10-CM | POA: Insufficient documentation

## 2022-02-06 DIAGNOSIS — E6609 Other obesity due to excess calories: Secondary | ICD-10-CM

## 2022-02-06 DIAGNOSIS — Z6835 Body mass index (BMI) 35.0-35.9, adult: Secondary | ICD-10-CM

## 2022-02-06 DIAGNOSIS — E559 Vitamin D deficiency, unspecified: Secondary | ICD-10-CM

## 2022-02-06 DIAGNOSIS — Z111 Encounter for screening for respiratory tuberculosis: Secondary | ICD-10-CM

## 2022-02-06 DIAGNOSIS — Z1231 Encounter for screening mammogram for malignant neoplasm of breast: Secondary | ICD-10-CM

## 2022-02-06 MED ORDER — RIZATRIPTAN BENZOATE 5 MG PO TABS: 5.0000 mg | ORAL_TABLET | ORAL | 1 refills | Status: DC | PRN

## 2022-02-06 MED ORDER — AMITRIPTYLINE HCL 10 MG PO TABS: 10.0000 mg | ORAL_TABLET | Freq: Every day | ORAL | 2 refills | Status: DC

## 2022-02-06 NOTE — Assessment & Plan Note (Signed)
Noted on past labs, recheck today and initiate supplement as needed.   

## 2022-02-06 NOTE — Assessment & Plan Note (Signed)
Chronic, ongoing, works night shift.  Will start Amitriptyline for migraines and see if benefit to sleep.  Recommend Magnesium 400 MG prior to bed.  May benefit from sleep study in future.

## 2022-02-06 NOTE — Progress Notes (Signed)
New Patient Office Visit  Subjective    Patient ID: Amanda Weeks, female    DOB: 07/08/1975  Age: 46 y.o. MRN: [id number]  CC:  Chief Complaint  Patient presents with   New Patient (Initial Visit)   Sleeping Problem    Patient says she would like to discuss a sleeping issue. Patient says what she is using at home isn't working.    Migraine    Patient says she would like to discuss a referral for Migraines. Patient says she is having migraines several times out of the month.    HPI Amanda Weeks presents for new patient visit to establish care.  Introduced to Publishing rights managernurse practitioner role and practice setting.  All questions answered.  Discussed provider/patient relationship and expectations. Currently in school for [facility] [facility] [employer].  Was a nurse for 10 years.    INSOMNIA Reports this as an ongoing issue for years.  She feels she has restless leg syndrome.  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 Duration: chronic Satisfied with sleep quality: no Difficulty falling asleep: yes Difficulty staying asleep: yes Waking a few hours after sleep onset: yes Early morning awakenings: no Daytime hypersomnolence: no Wakes feeling refreshed: yes when takes her sleep stuff Good sleep hygiene: yes Apnea: no Snoring: no Depressed/anxious mood: no Recent stress: no Restless legs/nocturnal leg cramps: yes Chronic pain/arthritis: no History of sleep study: no Treatments attempted: Z-quil, Melatonin, Benadryl      02/06/2022    8:15 AM  Depression screen PHQ 2/9  Decreased Interest 0  Down, Depressed, Hopeless 0  PHQ - 2 Score 0  Altered sleeping 3  Tired, decreased energy 2  Change in appetite 0  Feeling bad or failure about yourself  0  Trouble concentrating 0  Moving slowly or fidgety/restless 0  Suicidal thoughts 0  PHQ-9 Score 5  Difficult doing work/chores Somewhat difficult      02/06/2022    8:16 AM  GAD 7 : Generalized Anxiety Score  Nervous, Anxious,  on Edge 0  Control/stop worrying 0  Worry too much - different things 0  Trouble relaxing 0  Restless 0  Easily annoyed or irritable 0  Afraid - awful might happen 0  Total GAD 7 Score 0   HYPERLIPIDEMIA History of elevation on LDL in 2021 at 188. Supplements: none Aspirin:  no The ASCVD Risk score (Arnett DK, et al., 2019) failed to calculate for the following reasons:   Cannot find a previous HDL lab   Cannot find a previous total cholesterol lab Chest pain:  no Coronary artery disease:  no Family history CAD:  no Family history early CAD:  no   MIGRAINES On review chart, this has been ongoing issue for some years.  In past has taken Tramadol, no other preventatives.    Last labs in chart = A1c 6.1% April 2021, TSH 5.968, B12 167. Duration: chronic Onset: gradual Severity: 10/10 Quality: dull, aching, pressure-like, and throbbing Frequency: intermittent Location: front of head to back of head Headache duration: 24 hours  Radiation: no Time of day headache occurs: varies Alleviating factors: Benadryl Aggravating factors: lack of sleep Headache status at time of visit:  slight as had a tooth pulled recently Treatments attempted: Benadryl and Tramadol, Topamax, Imitrex (makes her heart race), Fioricet Aura: no Nausea:  only a couple times Vomiting: no Photophobia:  yes Phonophobia:  no Effect on social functioning:  yes Numbers of missed days of school/work  each month: a few times over years Confusion:  no Gait disturbance/ataxia:  no Behavioral changes:  no Fevers:  no   Outpatient Encounter Medications as of 02/06/2022  Medication Sig   acetaminophen (TYLENOL) 500 MG tablet Take 1 tablet (500 mg total) by mouth every 6 (six) hours as needed.   amitriptyline (ELAVIL) 10 MG tablet Take 1 tablet (10 mg total) by mouth at bedtime.   amoxicillin (AMOXIL) 500 MG capsule Take 500 mg by mouth every 8 (eight) hours.   rizatriptan (MAXALT) 5 MG tablet Take 1 tablet (5 mg  total) by mouth as needed for migraine. May repeat in 2 hours if needed   [EXPIRED] benzonatate (TESSALON PERLES) 100 MG capsule Take 1 capsule (100 mg total) by mouth 3 (three) times daily as needed for cough.   [DISCONTINUED] albuterol (PROVENTIL HFA) 108 (90 Base) MCG/ACT inhaler Inhale 2 puffs into the lungs every 4 (four) hours as needed for wheezing or shortness of breath. (Patient not taking: Reported on 02/06/2022)   [DISCONTINUED] cholecalciferol (VITAMIN D3) 25 MCG (1000 UT) tablet Take 1,000 Units by mouth daily. (Patient not taking: Reported on 02/06/2022)   [DISCONTINUED] naproxen (NAPROSYN) 500 MG tablet Take 1 tablet (500 mg total) by mouth 2 (two) times daily with a meal. (Patient not taking: Reported on 02/06/2022)   [DISCONTINUED] vitamin C (ASCORBIC ACID) 250 MG tablet Take 250 mg by mouth daily. (Patient not taking: Reported on 02/06/2022)   [DISCONTINUED] zinc gluconate 50 MG tablet Take 50 mg by mouth daily. (Patient not taking: Reported on 02/06/2022)   No facility-administered encounter medications on file as of 02/06/2022.    Past Medical History:  Diagnosis Date   Migraines     Past Surgical History:  Procedure Laterality Date   CESAREAN SECTION      Family History  Problem Relation Age of Onset   Diabetes Mother    Atrial fibrillation Mother    Diabetes Maternal Grandmother    Brain cancer Maternal Grandmother    Atrial fibrillation Maternal Grandmother    Aneurysm Maternal Grandfather     Social History   Socioeconomic History   Marital status: Married    Spouse name: Not on file   Number of children: 3   Years of education: Not on file   Highest education level: Not on file  Occupational History   Not on file  Tobacco Use   Smoking status: Never   Smokeless tobacco: Never  Substance and Sexual Activity   Alcohol use: Yes    Alcohol/week: 0.0 standard drinks of alcohol    Comment: Occasional   Drug use: Never   Sexual activity: Yes    Comment:  BTL  Other Topics Concern   Not on file  Social History Narrative   Not on file   Social Determinants of Health   Financial Resource Strain: Low Risk  (02/06/2022)   Overall Financial Resource Strain (CARDIA)    Difficulty of Paying Living Expenses: Not hard at all  Food Insecurity: No Food Insecurity (02/06/2022)   Hunger Vital Sign    Worried About Running Out of Food in the Last Year: Never true    Ran Out of Food in the Last Year: Never true  Transportation Needs: No Transportation Needs (02/06/2022)   PRAPARE - Administrator, Civil Service (Medical): No    Lack of Transportation (Non-Medical): No  Physical Activity: Inactive (02/06/2022)   Exercise Vital Sign    Days of Exercise per Week: 0 days  Minutes of Exercise per Session: 0 min  Stress: No Stress Concern Present (02/06/2022)   [employer] of Occupational Health - Occupational Stress Questionnaire    Feeling of Stress : Only a little  Social Connections: Moderately Isolated (02/06/2022)   Social Connection and Isolation Panel [NHANES]    Frequency of Communication with Friends and Family: More than three times a week    Frequency of Social Gatherings with Friends and Family: More than three times a week    Attends Religious Services: Never    Database administrator or Organizations: No    Attends Banker Meetings: Never    Marital Status: Married  Catering manager Violence: Not At Risk (02/06/2022)   Humiliation, Afraid, Rape, and Kick questionnaire    Fear of Current or Ex-Partner: No    Emotionally Abused: No    Physically Abused: No    Sexually Abused: No    Review of Systems  Constitutional:  Negative for chills, diaphoresis, fever and weight loss.  Respiratory:  Negative for cough, shortness of breath and wheezing.   Cardiovascular:  Negative for chest pain, palpitations, orthopnea and leg swelling.  Neurological:  Positive for headaches. Negative for dizziness, tremors and  weakness.  Endo/Heme/Allergies: Negative.   Psychiatric/Behavioral:  Negative for depression. The patient has insomnia. The patient is not nervous/anxious.       Objective    BP 113/74   Pulse 69   Temp 98.1 F (36.7 C) (Oral)   Ht 5\' 2"  (1.575 m)   Wt 194 lb 6.4 oz (88.2 kg)   SpO2 99%   BMI 35.56 kg/m   Physical Exam Vitals and nursing note reviewed.  Constitutional:      General: She is awake. She is not in acute distress.    Appearance: She is well-developed and well-groomed. She is obese. She is not ill-appearing or toxic-appearing.  HENT:     Head: Normocephalic.     Right Ear: Hearing normal.     Left Ear: Hearing normal.     Nose: Nose normal.     Mouth/Throat:     Mouth: Mucous membranes are moist.  Eyes:     General: Lids are normal.        Right eye: No discharge.        Left eye: No discharge.     Conjunctiva/sclera: Conjunctivae normal.     Pupils: Pupils are equal, round, and reactive to light.  Neck:     Thyroid: No thyromegaly.     Vascular: No carotid bruit or JVD.  Cardiovascular:     Rate and Rhythm: Normal rate and regular rhythm.     Heart sounds: Normal heart sounds. No murmur heard.    No gallop.  Pulmonary:     Effort: Pulmonary effort is normal.     Breath sounds: Normal breath sounds.  Abdominal:     General: Bowel sounds are normal.     Palpations: Abdomen is soft. There is no hepatomegaly or splenomegaly.  Musculoskeletal:     Cervical back: Normal range of motion and neck supple.     Right lower leg: No edema.     Left lower leg: No edema.  Lymphadenopathy:     Cervical: No cervical adenopathy.  Skin:    General: Skin is warm and dry.  Neurological:     Mental Status: She is alert and oriented to person, place, and time.  Psychiatric:        Attention and Perception: Attention  normal.        Mood and Affect: Mood normal.        Behavior: Behavior normal. Behavior is cooperative.        Thought Content: Thought content normal.         Judgment: Judgment normal.     Last CBC Lab Results  Component Value Date   WBC 8.6 05/16/2019   HGB 11.0 (L) 05/16/2019   HCT 33.1 (L) 05/16/2019   MCV 76.4 (L) 05/16/2019   MCH 25.4 (L) 05/16/2019   RDW 15.9 (H) 05/16/2019   PLT 297 05/16/2019   Last metabolic panel Lab Results  Component Value Date   GLUCOSE 103 (H) 05/16/2019   NA 140 05/16/2019   K 3.7 05/16/2019   CL 103 05/16/2019   CO2 23 05/16/2019   BUN 16 05/16/2019   CREATININE 0.41 (L) 05/16/2019   GFRNONAA >60 05/16/2019   CALCIUM 8.3 (L) 05/16/2019   PHOS 3.5 05/16/2019   PROT 6.5 05/16/2019   ALBUMIN 3.1 (L) 05/16/2019   BILITOT 0.6 05/16/2019   ALKPHOS 40 05/16/2019   AST 17 05/16/2019   ALT 48 (H) 05/16/2019   ANIONGAP 14 05/16/2019   Last thyroid functions Lab Results  Component Value Date   TSH 2.10 12/25/2011        Assessment & Plan:   Problem List Items Addressed This Visit       Cardiovascular and Mediastinum   Migraine headache - Primary    Chronic, ongoing with poor control.  No red flags.  Will start Amitriptyline 10 MG QHS which may offer benefit to both sleep and migraines, educated her on this medication and use.  Trial Maxalt for acute migraine, if elevation in heart rate with this (which she had with Imitrex) then will consider change to Nurtec.  Recommend she start taking Magnesium 400 MG every night.  Return to office in 4 weeks.      Relevant Medications   amitriptyline (ELAVIL) 10 MG tablet   rizatriptan (MAXALT) 5 MG tablet   Other Relevant Orders   CBC with Differential/Platelet   Comprehensive metabolic panel   TSH   Magnesium     Other   Elevated TSH    Noted on past labs, ?related to her migraines and RLS symptoms.  She has history of taking medication after pregnancy but they stopped it.  Check levels today and initiate medication as needed.      Relevant Orders   TSH   T4, free   Thyroid peroxidase antibody   Insomnia    Chronic, ongoing, works  night shift.  Will start Amitriptyline for migraines and see if benefit to sleep.  Recommend Magnesium 400 MG prior to bed.  May benefit from sleep study in future.      Obesity    BMI 35.56.  Recommended eating smaller high protein, low fat meals more frequently and exercising 30 mins a day 5 times a week with a goal of 10-15lb weight loss in the next 3 months. Patient voiced their understanding and motivation to adhere to these recommendations.       Prediabetes    A1c 6.1% in 2021.  Check A1c today and initiate medication as needed.  Heavy focus on diet and regular exercise.      Relevant Orders   Comprehensive metabolic panel   Lipid Panel w/o Chol/HDL Ratio   HgB A1c   Vitamin B12 deficiency    Noted on past labs, recheck today and initiate supplement as  needed.      Relevant Orders   CBC with Differential/Platelet   Vitamin B12   Vitamin D deficiency    Noted on past labs, recheck today and initiate supplement as needed.      Relevant Orders   VITAMIN D 25 Hydroxy (Vit-D Deficiency, Fractures)   Other Visit Diagnoses     Need for hepatitis C screening test       Hep C screen on labs today per guidelines for one time screening, discussed with patient.   Relevant Orders   Hepatitis C antibody   Screening-pulmonary TB       Quantiferon for school testing.   Relevant Orders   QuantiFERON-TB Gold Plus   Encounter for screening mammogram for malignant neoplasm of breast       Mammogram ordered and educated on this   Relevant Orders   MM 3D SCREEN BREAST BILATERAL   Encounter to establish care           Return in about 4 weeks (around 03/06/2022) for MIGRAINES AND INSOMNiA.   Marjie Skiff, NP

## 2022-02-06 NOTE — Assessment & Plan Note (Signed)
Chronic, ongoing with poor control.  No red flags.  Will start Amitriptyline 10 MG QHS which may offer benefit to both sleep and migraines, educated her on this medication and use.  Trial Maxalt for acute migraine, if elevation in heart rate with this (which she had with Imitrex) then will consider change to Nurtec.  Recommend she start taking Magnesium 400 MG every night.  Return to office in 4 weeks.

## 2022-02-06 NOTE — Assessment & Plan Note (Signed)
A1c 6.1% in 2021.  Check A1c today and initiate medication as needed.  Heavy focus on diet and regular exercise.

## 2022-02-06 NOTE — Assessment & Plan Note (Signed)
BMI 35.56.  Recommended eating smaller high protein, low fat meals more frequently and exercising 30 mins a day 5 times a week with a goal of 10-15lb weight loss in the next 3 months. Patient voiced their understanding and motivation to adhere to these recommendations.

## 2022-02-06 NOTE — Assessment & Plan Note (Signed)
Noted on past labs, ?related to her migraines and RLS symptoms.  She has history of taking medication after pregnancy but they stopped it.  Check levels today and initiate medication as needed.

## 2022-02-07 ENCOUNTER — Encounter: Payer: Self-pay | Admitting: Nurse Practitioner

## 2022-02-07 ENCOUNTER — Other Ambulatory Visit: Payer: Self-pay | Admitting: Nurse Practitioner

## 2022-02-07 DIAGNOSIS — E7800 Pure hypercholesterolemia, unspecified: Secondary | ICD-10-CM | POA: Insufficient documentation

## 2022-02-07 MED ORDER — LEVOTHYROXINE SODIUM 25 MCG PO TABS: 25.0000 ug | ORAL_TABLET | Freq: Every day | ORAL | 3 refills | Status: DC

## 2022-02-07 MED ORDER — CHOLECALCIFEROL 1.25 MG (50000 UT) PO TABS: 1.0000 | ORAL_TABLET | ORAL | 4 refills | Status: DC

## 2022-02-07 NOTE — Progress Notes (Signed)
Contacted via MyChart   Good evening [name], have lots of info for you: - Thyroid = you do have Hashimoto's thyroiditis as shown by elevation in thyroid antibody and TSH.  Free T4 is normal.  I do recommend we restart Levothyroxine at 25 MCG daily and adjust as needed.  We would recheck thyroid labs at next visit.  I suspect this is main reason for your restless leg issues and the mild low hemoglobin on labs.   - A1c is 6.1% still, in prediabetic range -- highly recommend focus on diet changes and regular activity at home to prevent this from entering diabetic range. - Vitamin D on low side, I am going to send in high weekly dose of Vitamin D3 for you to start taking for this to help overall bone health.  Vitamin B12 I like to see above 300, your level is lower. Start Vitamin B12 1000 MCG daily over the counter for nervous system health. - Kidney function, creatinine and eGFR, remains normal, as is liver function, AST and ALT.   - LDL, bad cholesterol, is elevated -- focus on diet and regular activity to work on lowering this level.  A daily Omega 3 supplement would be good to take too to help lower LDL and triglycerides. - Remainder of labs stable.  Any questions? Keep being amazing!!  Thank you for allowing me to participate in your care.  I appreciate you. Kindest regards, Colum Colt

## 2022-02-11 LAB — LIPID PANEL W/O CHOL/HDL RATIO
Cholesterol, Total: 198 mg/dL (ref 100–199)
HDL: 31 mg/dL — ABNORMAL LOW (ref >39)
LDL Chol Calc (NIH): 136 mg/dL — ABNORMAL HIGH (ref 0–99)
Triglycerides: 171 mg/dL — ABNORMAL HIGH (ref 0–149)
VLDL Cholesterol Cal: 31 mg/dL (ref 5–40)

## 2022-02-11 LAB — HEMOGLOBIN A1C
Est. average glucose Bld gHb Est-mCnc: 128 mg/dL
Hgb A1c MFr Bld: 6.1 % — ABNORMAL HIGH (ref 4.8–5.6)

## 2022-02-11 LAB — TSH: TSH: 6.82 u[IU]/mL — ABNORMAL HIGH (ref 0.450–4.500)

## 2022-02-11 LAB — QUANTIFERON-TB GOLD PLUS
QuantiFERON Mitogen Value: >10 IU/mL
QuantiFERON Nil Value: 0.06 IU/mL
QuantiFERON TB1 Ag Value: 0.07 IU/mL
QuantiFERON TB2 Ag Value: 0.07 IU/mL
QuantiFERON-TB Gold Plus: NEGATIVE

## 2022-02-11 LAB — CBC WITH DIFFERENTIAL/PLATELET
Basophils Absolute: 0.1 10*3/uL (ref 0.0–0.2)
Basos: 1 %
EOS (ABSOLUTE): 0.2 10*3/uL (ref 0.0–0.4)
Eos: 2 %
Hematocrit: 34.1 % (ref 34.0–46.6)
Hemoglobin: 10.8 g/dL — ABNORMAL LOW (ref 11.1–15.9)
Immature Grans (Abs): 0 10*3/uL (ref 0.0–0.1)
Immature Granulocytes: 0 %
Lymphocytes Absolute: 2.4 10*3/uL (ref 0.7–3.1)
Lymphs: 25 %
MCH: 25.1 pg — ABNORMAL LOW (ref 26.6–33.0)
MCHC: 31.7 g/dL (ref 31.5–35.7)
MCV: 79 fL (ref 79–97)
Monocytes Absolute: 0.7 10*3/uL (ref 0.1–0.9)
Monocytes: 8 %
Neutrophils Absolute: 6 10*3/uL (ref 1.4–7.0)
Neutrophils: 64 %
Platelets: 404 10*3/uL (ref 150–450)
RBC: 4.31 x10E6/uL (ref 3.77–5.28)
RDW: 14.7 % (ref 11.7–15.4)
WBC: 9.3 10*3/uL (ref 3.4–10.8)

## 2022-02-11 LAB — COMPREHENSIVE METABOLIC PANEL
ALT: 10 IU/L (ref 0–32)
AST: 11 IU/L (ref 0–40)
Albumin/Globulin Ratio: 1.6 (ref 1.2–2.2)
Albumin: 4.1 g/dL (ref 3.9–4.9)
Alkaline Phosphatase: 62 IU/L (ref 44–121)
BUN/Creatinine Ratio: 19 (ref 9–23)
BUN: 10 mg/dL (ref 6–24)
Bilirubin Total: <0.2 mg/dL (ref 0.0–1.2)
CO2: 25 mmol/L (ref 20–29)
Calcium: 9.2 mg/dL (ref 8.7–10.2)
Chloride: 102 mmol/L (ref 96–106)
Creatinine, Ser: 0.54 mg/dL — ABNORMAL LOW (ref 0.57–1.00)
Globulin, Total: 2.5 g/dL (ref 1.5–4.5)
Glucose: 96 mg/dL (ref 70–99)
Potassium: 3.9 mmol/L (ref 3.5–5.2)
Sodium: 141 mmol/L (ref 134–144)
Total Protein: 6.6 g/dL (ref 6.0–8.5)
eGFR: 117 mL/min/{1.73_m2} (ref >59)

## 2022-02-11 LAB — VITAMIN B12: Vitamin B-12: 239 pg/mL (ref 232–1245)

## 2022-02-11 LAB — MAGNESIUM: Magnesium: 1.8 mg/dL (ref 1.6–2.3)

## 2022-02-11 LAB — T4, FREE: Free T4: 0.92 ng/dL (ref 0.82–1.77)

## 2022-02-11 LAB — THYROID PEROXIDASE ANTIBODY: Thyroperoxidase Ab SerPl-aCnc: >600 IU/mL — ABNORMAL HIGH (ref 0–34)

## 2022-02-11 LAB — VITAMIN D 25 HYDROXY (VIT D DEFICIENCY, FRACTURES): Vit D, 25-Hydroxy: 13.4 ng/mL — ABNORMAL LOW (ref 30.0–100.0)

## 2022-02-11 LAB — HEPATITIS C ANTIBODY: Hep C Virus Ab: NONREACTIVE

## 2022-02-11 NOTE — Progress Notes (Signed)
Contacted via MyChart   Tb testing is negative:)

## 2022-03-13 ENCOUNTER — Ambulatory Visit: Payer: No Typology Code available for payment source | Admitting: Nurse Practitioner

## 2022-03-13 DIAGNOSIS — G43009 Migraine without aura, not intractable, without status migrainosus: Secondary | ICD-10-CM

## 2022-03-13 DIAGNOSIS — D649 Anemia, unspecified: Secondary | ICD-10-CM

## 2022-03-13 DIAGNOSIS — E559 Vitamin D deficiency, unspecified: Secondary | ICD-10-CM

## 2022-03-13 DIAGNOSIS — E063 Autoimmune thyroiditis: Secondary | ICD-10-CM

## 2022-05-28 ENCOUNTER — Encounter: Payer: Self-pay | Admitting: Nurse Practitioner

## 2022-06-03 ENCOUNTER — Encounter: Payer: Self-pay | Admitting: Nurse Practitioner

## 2022-06-03 ENCOUNTER — Telehealth (INDEPENDENT_AMBULATORY_CARE_PROVIDER_SITE_OTHER): Payer: 59 | Admitting: Nurse Practitioner

## 2022-06-03 DIAGNOSIS — R0981 Nasal congestion: Secondary | ICD-10-CM | POA: Diagnosis not present

## 2022-06-03 MED ORDER — METHYLPREDNISOLONE 4 MG PO TBPK: ORAL_TABLET | ORAL | 0 refills | Status: DC

## 2022-06-03 NOTE — Progress Notes (Signed)
There were no vitals taken for this visit.   Subjective:    Patient ID: Amanda Weeks, female    DOB: 1975-07-02, 47 y.o.   MRN: [id number]  HPI: [name] is a 47 y.o. female  Chief Complaint  Patient presents with   Nasal Congestion    Patient states she was sick right before christmas for about a week.  States she felt a lot of pressure and sore to the touch and thought it was a sinus infection.  She is still sick from that time.  Her nose is stopped up.   Patient states she was sick right before christmas for about a week.  States she felt a lot of pressure and sore to the touch and thought it was a sinus infection.  She is still sick from that time.  Her nose is stopped up.  She has tried nasal flushes but it isn't helping.  Using nasal congestion.  Biggest concern is that she can't breath and nose is stopped up.    Relevant past medical, surgical, family and social history reviewed and updated as indicated. Interim medical history since our last visit reviewed. Allergies and medications reviewed and updated.  Review of Systems  HENT:  Positive for congestion.   Respiratory:  Negative for cough, shortness of breath and wheezing.     Per HPI unless specifically indicated above     Objective:    There were no vitals taken for this visit.  Wt Readings from Last 3 Encounters:  02/06/22 194 lb 6.4 oz (88.2 kg)  11/28/19 182 lb (82.6 kg)  05/11/19 190 lb (86.2 kg)    Physical Exam Vitals and nursing note reviewed.  HENT:     Head: Normocephalic.     Right Ear: Hearing normal.     Left Ear: Hearing normal.     Nose: Nose normal.  Eyes:     Pupils: Pupils are equal, round, and reactive to light.  Pulmonary:     Effort: Pulmonary effort is normal. No respiratory distress.  Neurological:     Mental Status: She is alert.  Psychiatric:        Mood and Affect: Mood normal.        Behavior: Behavior normal.        Thought Content: Thought content normal.         Judgment: Judgment normal.     Results for orders placed or performed in visit on 02/06/22  CBC with Differential/Platelet  Result Value Ref Range   WBC 9.3 3.4 - 10.8 x10E3/uL   RBC 4.31 3.77 - 5.28 x10E6/uL   Hemoglobin 10.8 (L) 11.1 - 15.9 g/dL   Hematocrit 16.0 73.7 - 46.6 %   MCV 79 79 - 97 fL   MCH 25.1 (L) 26.6 - 33.0 pg   MCHC 31.7 31.5 - 35.7 g/dL   RDW 10.6 26.9 - 48.5 %   Platelets 404 150 - 450 x10E3/uL   Neutrophils 64 Not Estab. %   Lymphs 25 Not Estab. %   Monocytes 8 Not Estab. %   Eos 2 Not Estab. %   Basos 1 Not Estab. %   Neutrophils Absolute 6.0 1.4 - 7.0 x10E3/uL   Lymphocytes Absolute 2.4 0.7 - 3.1 x10E3/uL   Monocytes Absolute 0.7 0.1 - 0.9 x10E3/uL   EOS (ABSOLUTE) 0.2 0.0 - 0.4 x10E3/uL   Basophils Absolute 0.1 0.0 - 0.2 x10E3/uL   Immature Granulocytes 0 Not Estab. %   Immature Grans (Abs)  0.0 0.0 - 0.1 x10E3/uL  Comprehensive metabolic panel  Result Value Ref Range   Glucose 96 70 - 99 mg/dL   BUN 10 6 - 24 mg/dL   Creatinine, Ser 4.17 (L) 0.57 - 1.00 mg/dL   eGFR 408 >14 GY/JEH/6.31   BUN/Creatinine Ratio 19 9 - 23   Sodium 141 134 - 144 mmol/L   Potassium 3.9 3.5 - 5.2 mmol/L   Chloride 102 96 - 106 mmol/L   CO2 25 20 - 29 mmol/L   Calcium 9.2 8.7 - 10.2 mg/dL   Total Protein 6.6 6.0 - 8.5 g/dL   Albumin 4.1 3.9 - 4.9 g/dL   Globulin, Total 2.5 1.5 - 4.5 g/dL   Albumin/Globulin Ratio 1.6 1.2 - 2.2   Bilirubin Total <0.2 0.0 - 1.2 mg/dL   Alkaline Phosphatase 62 44 - 121 IU/L   AST 11 0 - 40 IU/L   ALT 10 0 - 32 IU/L  Lipid Panel w/o Chol/HDL Ratio  Result Value Ref Range   Cholesterol, Total 198 100 - 199 mg/dL   Triglycerides 497 (H) 0 - 149 mg/dL   HDL 31 (L) >02 mg/dL   VLDL Cholesterol Cal 31 5 - 40 mg/dL   LDL Chol Calc (NIH) 637 (H) 0 - 99 mg/dL  TSH  Result Value Ref Range   TSH 6.820 (H) 0.450 - 4.500 uIU/mL  T4, free  Result Value Ref Range   Free T4 0.92 0.82 - 1.77 ng/dL  Vitamin C58  Result Value Ref Range    Vitamin B-12 239 232 - 1,245 pg/mL  VITAMIN D 25 Hydroxy (Vit-D Deficiency, Fractures)  Result Value Ref Range   Vit D, 25-Hydroxy 13.4 (L) 30.0 - 100.0 ng/mL  Hepatitis C antibody  Result Value Ref Range   Hep C Virus Ab Non Reactive Non Reactive  QuantiFERON-TB Gold Plus  Result Value Ref Range   QuantiFERON Incubation Incubation performed.    QuantiFERON Criteria Comment    QuantiFERON TB1 Ag Value 0.07 IU/mL   QuantiFERON TB2 Ag Value 0.07 IU/mL   QuantiFERON Nil Value 0.06 IU/mL   QuantiFERON Mitogen Value >10.00 IU/mL   QuantiFERON-TB Gold Plus Negative Negative  HgB A1c  Result Value Ref Range   Hgb A1c MFr Bld 6.1 (H) 4.8 - 5.6 %   Est. average glucose Bld gHb Est-mCnc 128 mg/dL  Magnesium  Result Value Ref Range   Magnesium 1.8 1.6 - 2.3 mg/dL  Thyroid peroxidase antibody  Result Value Ref Range   Thyroperoxidase Ab SerPl-aCnc >600 (H) 0 - 34 IU/mL      Assessment & Plan:   Problem List Items Addressed This Visit   None Visit Diagnoses     Sinus congestion    -  Primary   Will treat with prednisone. Complete course as instructed.  Follow up if symptoms not improved.        Follow up plan: Return if symptoms worsen or fail to improve.  This visit was completed via MyChart due to the restrictions of the COVID-19 pandemic. All issues as above were discussed and addressed. Physical exam was done as above through visual confirmation on MyChart. If it was felt that the patient should be evaluated in the office, they were directed there. The patient verbally consented to this visit. Location of the patient: Home Location of the provider: Office Those involved with this call:  Provider: Larae Grooms, NP CMA: Wilhemena Durie, CMA Front Desk/Registration: Servando Snare This encounter was conducted via video.  I  spent 20 dedicated to the care of this patient on the date of this encounter to include previsit review of symptoms, plan of care and follow up, face to  face time with the patient, and post visit ordering of testing.

## 2022-07-08 ENCOUNTER — Encounter: Payer: Self-pay | Admitting: Nurse Practitioner

## 2022-07-08 DIAGNOSIS — R7303 Prediabetes: Secondary | ICD-10-CM

## 2022-08-19 ENCOUNTER — Encounter: Payer: Self-pay | Admitting: Nurse Practitioner

## 2022-08-27 ENCOUNTER — Encounter: Payer: Self-pay | Admitting: Nurse Practitioner

## 2022-08-27 ENCOUNTER — Ambulatory Visit (INDEPENDENT_AMBULATORY_CARE_PROVIDER_SITE_OTHER): Payer: 59 | Admitting: Nurse Practitioner

## 2022-08-27 VITALS — BP 110/68 | HR 88 | Temp 98.1°F | Ht 62.01 in | Wt 193.8 lb

## 2022-08-27 DIAGNOSIS — N920 Excessive and frequent menstruation with regular cycle: Secondary | ICD-10-CM

## 2022-08-27 DIAGNOSIS — E063 Autoimmune thyroiditis: Secondary | ICD-10-CM

## 2022-08-27 DIAGNOSIS — Z6835 Body mass index (BMI) 35.0-35.9, adult: Secondary | ICD-10-CM

## 2022-08-27 DIAGNOSIS — E6609 Other obesity due to excess calories: Secondary | ICD-10-CM

## 2022-08-27 DIAGNOSIS — R7303 Prediabetes: Secondary | ICD-10-CM | POA: Diagnosis not present

## 2022-08-27 DIAGNOSIS — F5101 Primary insomnia: Secondary | ICD-10-CM

## 2022-08-27 MED ORDER — CHOLECALCIFEROL 1.25 MG (50000 UT) PO TABS: 1.0000 | ORAL_TABLET | ORAL | 4 refills | Status: DC

## 2022-08-27 MED ORDER — TRAZODONE HCL 50 MG PO TABS: 25.0000 mg | ORAL_TABLET | Freq: Every evening | ORAL | 3 refills | Status: DC | PRN

## 2022-08-27 NOTE — Assessment & Plan Note (Signed)
BMI 35.44.  Recommended eating smaller high protein, low fat meals more frequently and exercising 30 mins a day 5 times a week with a goal of 10-15lb weight loss in the next 3 months. Patient voiced their understanding and motivation to adhere to these recommendations.  

## 2022-08-27 NOTE — Assessment & Plan Note (Signed)
Chronic, ongoing, works night shift.  Will stop Amitriptyline and start Trazodone 25-50 MG nightly, adjust as needed based on benefit.  Recommend Magnesium 400 MG prior to bed.  May benefit from sleep study in future.

## 2022-08-27 NOTE — Progress Notes (Signed)
BP 110/68   Pulse 88   Temp 98.1 F (36.7 C) (Oral)   Ht 5' 2.01" (1.575 m)   Wt 193 lb 12.8 oz (87.9 kg)   SpO2 97%   BMI 35.44 kg/m    Subjective:    Patient ID: Amanda Weeks, female    DOB: 1976-02-28, 47 y.o.   MRN: [id number]  HPI: Amanda Weeks is a 47 y.o. female  Chief Complaint  Patient presents with   Weight Loss    Would like to try [facility]   Sleep habits    Not sleeping and needs something to help sleep   Would like to see GYN to discuss menorrhagia and IUD or Nexplanon.  INSOMNIA Currently works night shift at [employer] 7 pm to 7 am, trouble falling asleep when comes home.  Takes Benadryl when she comes home on days she works, feels like she now gets RLS with Benadryl so has stopped taking.  Melatonin did not work in past.   Duration: chronic Satisfied with sleep quality: no Difficulty falling asleep: yes Difficulty staying asleep: no Waking a few hours after sleep onset: yes Early morning awakenings: no Daytime hypersomnolence: no Wakes feeling refreshed: yes Good sleep hygiene: yes Apnea: no Snoring:  occasional Depressed/anxious mood: no Recent stress: no Restless legs/nocturnal leg cramps:  with Benadryl Chronic pain/arthritis: no History of sleep study: no Treatments attempted: melatonin and benadryl    HYPOTHYROIDISM Has not taken Levothyroxine recently, ran out of refills and did not pick up new script.  Has been out of it since October.  Would like to lose weight, is interested in [employer] -- goal is to reach healthy weight and get out of prediabetic range.  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  Is looking to get treadmill upstairs to utilize for walking and has cut back on sodas (drinks 3 sodas nights she works and none at home) and drinking more water.  A1c last check in 2023 was 6.1%. Thyroid control status:uncontrolled Satisfied with current treatment? yes Medication side effects:  no Medication compliance: poor compliance Etiology of hypothyroidism: Hashimoto's Recent dose adjustment:no Fatigue: no Cold intolerance: no Heat intolerance: no Weight gain: yes Weight loss: no Constipation: no Diarrhea/loose stools: no Palpitations: no Lower extremity edema: no Anxiety/depressed mood: no   Relevant past medical, surgical, family and social history reviewed and updated as indicated. Interim medical history since our last visit reviewed. Allergies and medications reviewed and updated.  Review of Systems  Constitutional:  Negative for activity change, appetite change, diaphoresis, fatigue and fever.  Respiratory:  Negative for cough, chest tightness and shortness of breath.   Cardiovascular:  Negative for chest pain, palpitations and leg swelling.  Gastrointestinal: Negative.   Endocrine: Negative for cold intolerance, heat intolerance, polydipsia, polyphagia and polyuria.  Neurological: Negative.   Psychiatric/Behavioral:  Positive for sleep disturbance. Negative for self-injury and suicidal ideas. The patient is not nervous/anxious.     Per HPI unless specifically indicated above     Objective:    BP 110/68   Pulse 88   Temp 98.1 F (36.7 C) (Oral)   Ht 5' 2.01" (1.575 m)   Wt 193 lb 12.8 oz (87.9 kg)   SpO2 97%   BMI 35.44 kg/m   Wt Readings from Last 3 Encounters:  08/27/22 193 lb 12.8 oz (87.9 kg)  02/06/22 194 lb 6.4 oz (88.2 kg)  11/28/19 182 lb (82.6 kg)    Physical Exam Vitals and nursing  note reviewed.  Constitutional:      General: She is awake. She is not in acute distress.    Appearance: She is well-developed and well-groomed. She is obese. She is not ill-appearing or toxic-appearing.  HENT:     Head: Normocephalic.     Right Ear: Hearing normal.     Left Ear: Hearing normal.     Nose: Nose normal.     Mouth/Throat:     Mouth: Mucous membranes are moist.  Eyes:     General: Lids are normal.        Right eye: No discharge.         Left eye: No discharge.     Conjunctiva/sclera: Conjunctivae normal.     Pupils: Pupils are equal, round, and reactive to light.  Neck:     Thyroid: No thyromegaly.     Vascular: No carotid bruit or JVD.  Cardiovascular:     Rate and Rhythm: Normal rate and regular rhythm.     Heart sounds: Normal heart sounds. No murmur heard.    No gallop.  Pulmonary:     Effort: Pulmonary effort is normal.     Breath sounds: Normal breath sounds.  Abdominal:     General: Bowel sounds are normal.     Palpations: Abdomen is soft. There is no hepatomegaly or splenomegaly.  Musculoskeletal:     Cervical back: Normal range of motion and neck supple.     Right lower leg: No edema.     Left lower leg: No edema.  Lymphadenopathy:     Cervical: No cervical adenopathy.  Skin:    General: Skin is warm and dry.  Neurological:     Mental Status: She is alert and oriented to person, place, and time.  Psychiatric:        Attention and Perception: Attention normal.        Mood and Affect: Mood normal.        Behavior: Behavior normal. Behavior is cooperative.        Thought Content: Thought content normal.        Judgment: Judgment normal.    Results for orders placed or performed in visit on 02/06/22  CBC with Differential/Platelet  Result Value Ref Range   WBC 9.3 3.4 - 10.8 x10E3/uL   RBC 4.31 3.77 - 5.28 x10E6/uL   Hemoglobin 10.8 (L) 11.1 - 15.9 g/dL   Hematocrit 34.1 34.0 - 46.6 %   MCV 79 79 - 97 fL   MCH 25.1 (L) 26.6 - 33.0 pg   MCHC 31.7 31.5 - 35.7 g/dL   RDW 14.7 11.7 - 15.4 %   Platelets 404 150 - 450 x10E3/uL   Neutrophils 64 Not Estab. %   Lymphs 25 Not Estab. %   Monocytes 8 Not Estab. %   Eos 2 Not Estab. %   Basos 1 Not Estab. %   Neutrophils Absolute 6.0 1.4 - 7.0 x10E3/uL   Lymphocytes Absolute 2.4 0.7 - 3.1 x10E3/uL   Monocytes Absolute 0.7 0.1 - 0.9 x10E3/uL   EOS (ABSOLUTE) 0.2 0.0 - 0.4 x10E3/uL   Basophils Absolute 0.1 0.0 - 0.2 x10E3/uL   Immature Granulocytes 0  Not Estab. %   Immature Grans (Abs) 0.0 0.0 - 0.1 x10E3/uL  Comprehensive metabolic panel  Result Value Ref Range   Glucose 96 70 - 99 mg/dL   BUN 10 6 - 24 mg/dL   Creatinine, Ser 0.54 (L) 0.57 - 1.00 mg/dL   eGFR 117 >59 mL/min/1.73  BUN/Creatinine Ratio 19 9 - 23   Sodium 141 134 - 144 mmol/L   Potassium 3.9 3.5 - 5.2 mmol/L   Chloride 102 96 - 106 mmol/L   CO2 25 20 - 29 mmol/L   Calcium 9.2 8.7 - 10.2 mg/dL   Total Protein 6.6 6.0 - 8.5 g/dL   Albumin 4.1 3.9 - 4.9 g/dL   Globulin, Total 2.5 1.5 - 4.5 g/dL   Albumin/Globulin Ratio 1.6 1.2 - 2.2   Bilirubin Total <0.2 0.0 - 1.2 mg/dL   Alkaline Phosphatase 62 44 - 121 IU/L   AST 11 0 - 40 IU/L   ALT 10 0 - 32 IU/L  Lipid Panel w/o Chol/HDL Ratio  Result Value Ref Range   Cholesterol, Total 198 100 - 199 mg/dL   Triglycerides 171 (H) 0 - 149 mg/dL   HDL 31 (L) >39 mg/dL   VLDL Cholesterol Cal 31 5 - 40 mg/dL   LDL Chol Calc (NIH) 136 (H) 0 - 99 mg/dL  TSH  Result Value Ref Range   TSH 6.820 (H) 0.450 - 4.500 uIU/mL  T4, free  Result Value Ref Range   Free T4 0.92 0.82 - 1.77 ng/dL  Vitamin B12  Result Value Ref Range   Vitamin B-12 239 232 - 1,245 pg/mL  VITAMIN D 25 Hydroxy (Vit-D Deficiency, Fractures)  Result Value Ref Range   Vit D, 25-Hydroxy 13.4 (L) 30.0 - 100.0 ng/mL  Hepatitis C antibody  Result Value Ref Range   Hep C Virus Ab Non Reactive Non Reactive  QuantiFERON-TB Gold Plus  Result Value Ref Range   QuantiFERON Incubation Incubation performed.    QuantiFERON Criteria Comment    QuantiFERON TB1 Ag Value 0.07 IU/mL   QuantiFERON TB2 Ag Value 0.07 IU/mL   QuantiFERON Nil Value 0.06 IU/mL   QuantiFERON Mitogen Value >10.00 IU/mL   QuantiFERON-TB Gold Plus Negative Negative  HgB A1c  Result Value Ref Range   Hgb A1c MFr Bld 6.1 (H) 4.8 - 5.6 %   Est. average glucose Bld gHb Est-mCnc 128 mg/dL  Magnesium  Result Value Ref Range   Magnesium 1.8 1.6 - 2.3 mg/dL  Thyroid peroxidase antibody   Result Value Ref Range   Thyroperoxidase Ab SerPl-aCnc >600 (H) 0 - 34 IU/mL      Assessment & Plan:   Problem List Items Addressed This Visit       Endocrine   Hashimoto's thyroiditis - Primary    Chronic, ongoing, not taking medication consistently.  Highly recommend she restart and take daily as ordered with no missed doses.  Thyroid labs today and adjust regimen as needed.      Relevant Orders   T4, free   TSH     Other   Insomnia    Chronic, ongoing, works night shift.  Will stop Amitriptyline and start Trazodone 25-50 MG nightly, adjust as needed based on benefit.  Recommend Magnesium 400 MG prior to bed.  May benefit from sleep study in future.      Obesity    BMI 35.44.  Recommended eating smaller high protein, low fat meals more frequently and exercising 30 mins a day 5 times a week with a goal of 10-15lb weight loss in the next 3 months. Patient voiced their understanding and motivation to adhere to these recommendations.       Prediabetes    A1c 6.1% on recent check.  Check A1c today and initiate medication as needed.  Heavy focus on diet and regular exercise.  Relevant Orders   HgB A1c   Other Visit Diagnoses     Menorrhagia with regular cycle       Referral to GYN placed.   Relevant Orders   Ambulatory referral to Gynecology        Follow up plan: Return in about 8 weeks (around 10/22/2022) for HYPOTHYROID, INSOMNIA.

## 2022-08-27 NOTE — Assessment & Plan Note (Signed)
Chronic, ongoing, not taking medication consistently.  Highly recommend she restart and take daily as ordered with no missed doses.  Thyroid labs today and adjust regimen as needed.

## 2022-08-27 NOTE — Assessment & Plan Note (Signed)
A1c 6.1% on recent check.  Check A1c today and initiate medication as needed.  Heavy focus on diet and regular exercise.

## 2022-08-27 NOTE — Patient Instructions (Signed)

## 2022-08-28 NOTE — Progress Notes (Signed)
Contacted via [place] afternoon Amanda Weeks, your A1c had returned but thyroid labs pending still.  A1c has trended down a little to 6%, previous was 6.1%.  Unfortunately, we will not be able to get injectable medications covered with prediabetes.  We may be able to get Metformin covered, which can help with some weight loss.  Would you like to try that? Keep being amazing!!  Thank you for allowing me to participate in your care.  I appreciate you. Kindest regards, Normal Recinos

## 2022-08-29 ENCOUNTER — Other Ambulatory Visit: Payer: Self-pay | Admitting: Nurse Practitioner

## 2022-08-29 LAB — HEMOGLOBIN A1C
Est. average glucose Bld gHb Est-mCnc: 126 mg/dL
Hgb A1c MFr Bld: 6 % — ABNORMAL HIGH (ref 4.8–5.6)

## 2022-08-29 LAB — TSH: TSH: 3.67 u[IU]/mL (ref 0.450–4.500)

## 2022-08-29 LAB — T4, FREE: Free T4: 1.17 ng/dL (ref 0.82–1.77)

## 2022-08-29 MED ORDER — LEVOTHYROXINE SODIUM 25 MCG PO TABS: 25.0000 ug | ORAL_TABLET | Freq: Every day | ORAL | 4 refills | Status: DC

## 2022-08-29 NOTE — Progress Notes (Signed)
Contacted via MyChart   Good morning Amanda Weeks, thyroid labs are actually improving even with some missed doses.  I recommend continue current Levothyroxine 25 MCG daily.  I sent in refills.  Any questions? Keep being amazing!!  Thank you for allowing me to participate in your care.  I appreciate you. Kindest regards, Ammy Lienhard

## 2022-09-08 ENCOUNTER — Encounter: Payer: Self-pay | Admitting: Obstetrics and Gynecology

## 2022-09-08 ENCOUNTER — Encounter: Payer: Self-pay | Admitting: Nurse Practitioner

## 2022-09-11 ENCOUNTER — Encounter: Payer: Self-pay | Admitting: Nurse Practitioner

## 2022-09-11 NOTE — Telephone Encounter (Signed)
Attempted to reach patient LVM regarding message per provider.  Asked patient to give office a call back so that we can get her scheduled.  Put in CRM.

## 2022-09-16 ENCOUNTER — Telehealth (INDEPENDENT_AMBULATORY_CARE_PROVIDER_SITE_OTHER): Payer: 59 | Admitting: Family Medicine

## 2022-09-16 ENCOUNTER — Telehealth: Payer: 59 | Admitting: Nurse Practitioner

## 2022-09-16 ENCOUNTER — Encounter: Payer: Self-pay | Admitting: Family Medicine

## 2022-09-16 DIAGNOSIS — R0981 Nasal congestion: Secondary | ICD-10-CM

## 2022-09-16 MED ORDER — METHYLPREDNISOLONE 4 MG PO TBPK
ORAL_TABLET | ORAL | 0 refills | Status: DC
Start: 2022-09-16 — End: 2022-10-24

## 2022-09-16 NOTE — Progress Notes (Signed)
There were no vitals taken for this visit.   Subjective:    Patient ID: Amanda Weeks, female    DOB: 1976/04/01, 47 y.o.   MRN: [id number]  HPI: Amanda Weeks is a 47 y.o. female  Chief Complaint  Patient presents with   Sinusitis    Pt states she has been having nasal congestion for the last 3 weeks. States she has been taking allergy medication but it has not helped.     "This visit was completed via telephone due to the restrictions of the COVID-19 pandemic. All issues as above were discussed and addressed but no physical exam was performed. If it was felt that the patient should be evaluated in the office, they were directed there. The patient verbally consented to this visit. Patient was unable to complete an audio/visual visit due to Technical difficulties", "Lack of internet. Due to the catastrophic nature of the COVID-19 pandemic, this visit was done through audio contact only.","This visit was completed via video visit through MyChart due to the restrictions of the COVID-19 pandemic. All issues as above were discussed and addressed. Physical exam was done as above through visual confirmation on video through MyChart. If it was felt that the patient should be evaluated in the office, they were directed there. The patient verbally consented to this visit."} Location of the patient: home Location of the provider: work Those involved with this call:  Provider:  Prescott Gum, NP CMA: Wilhemena Durie, CMA Front Desk/Registration: Sunday Spillers Djimraou  Time spent on call:  15 minutes on the phone discussing health concerns. 10 minutes total spent in review of patient's record and preparation of their chart.   Congestion Started 3-4 weeks ago. She has tried Zyrtec, the allergies did stop, but has continued congestion. She is using OTC severe congestion nasal spray, every 3-4 hours, it helps a little but has to use it frequently for it to work. Worst  symptom:Congestion Fever: no Cough: no Shortness of breath: no Wheezing: no Chest pain: no Chest tightness: no Chest congestion: no Nasal congestion: no Runny nose: no Post nasal drip: yes Sneezing: yes Sore throat: no Swollen glands: no Sinus pressure: yes Headache: no Face pain: yes Toothache: no Ear pain: no  Ear pressure: no  Eyes red/itching:yes Eye drainage/crusting: no  Vomiting: no Rash: no Fatigue: no Sick contacts: no Strep contacts: no  Context: stable Recurrent sinusitis: no Relief with OTC cold/cough medications: no  Treatments attempted: Zyrtec and nasal spray Denies sob and chest pain.   Relevant past medical, surgical, family and social history reviewed and updated as indicated. Interim medical history since our last visit reviewed. Allergies and medications reviewed and updated.  Review of Systems  Constitutional:  Negative for chills, fatigue and fever.  HENT:  Positive for congestion, postnasal drip, rhinorrhea, sinus pressure and sneezing. Negative for ear pain, sinus pain and sore throat.   Eyes:  Negative for discharge, redness and itching.  Respiratory:  Negative for cough, chest tightness, shortness of breath and wheezing.   Cardiovascular:  Negative for chest pain.  Gastrointestinal:  Negative for vomiting.  Skin:  Negative for rash.  Neurological:  Negative for headaches.    Per HPI unless specifically indicated above     Objective:    There were no vitals taken for this visit.  Wt Readings from Last 3 Encounters:  08/27/22 193 lb 12.8 oz (87.9 kg)  02/06/22 194 lb 6.4 oz (88.2 kg)  11/28/19 182 lb (82.6 kg)  Physical Exam  Results for orders placed or performed in visit on 08/27/22  T4, free  Result Value Ref Range   Free T4 1.17 0.82 - 1.77 ng/dL  TSH  Result Value Ref Range   TSH 3.670 0.450 - 4.500 uIU/mL  HgB A1c  Result Value Ref Range   Hgb A1c MFr Bld 6.0 (H) 4.8 - 5.6 %   Est. average glucose Bld gHb Est-mCnc  126 mg/dL      Assessment & Plan:   Problem List Items Addressed This Visit   None Visit Diagnoses     Sinus congestion    -  Primary   Acute, ongoing. Medrol dose pack given to help with symptoms. Recommend use of nasal spray every 12 hours to prevent rebound rhinitis. F/u if symptoms worsen.   Relevant Medications   methylPREDNISolone (MEDROL DOSEPAK) 4 MG TBPK tablet        Follow up plan: Return if symptoms worsen or fail to improve.

## 2022-09-17 ENCOUNTER — Telehealth: Payer: 59 | Admitting: Nurse Practitioner

## 2022-10-09 ENCOUNTER — Encounter: Payer: Self-pay | Admitting: Licensed Practical Nurse

## 2022-10-13 ENCOUNTER — Encounter: Payer: Self-pay | Admitting: Licensed Practical Nurse

## 2022-10-24 ENCOUNTER — Encounter: Payer: Self-pay | Admitting: Physician Assistant

## 2022-10-24 ENCOUNTER — Encounter: Payer: Self-pay | Admitting: Nurse Practitioner

## 2022-10-24 ENCOUNTER — Ambulatory Visit: Payer: 59 | Admitting: Physician Assistant

## 2022-10-24 VITALS — BP 131/81 | HR 89 | Temp 97.9°F | Ht 62.01 in | Wt 196.0 lb

## 2022-10-24 DIAGNOSIS — R0981 Nasal congestion: Secondary | ICD-10-CM | POA: Diagnosis not present

## 2022-10-24 DIAGNOSIS — T485X5A Adverse effect of other anti-common-cold drugs, initial encounter: Secondary | ICD-10-CM

## 2022-10-24 DIAGNOSIS — J302 Other seasonal allergic rhinitis: Secondary | ICD-10-CM | POA: Diagnosis not present

## 2022-10-24 DIAGNOSIS — J310 Chronic rhinitis: Secondary | ICD-10-CM

## 2022-10-24 MED ORDER — PREDNISONE 20 MG PO TABS
ORAL_TABLET | ORAL | 0 refills | Status: DC
Start: 2022-10-24 — End: 2024-04-17

## 2022-10-24 MED ORDER — RYALTRIS 665-25 MCG/ACT NA SUSP
2.0000 | Freq: Two times a day (BID) | NASAL | 1 refills | Status: DC
Start: 2022-10-24 — End: 2024-04-17

## 2022-10-24 NOTE — Telephone Encounter (Signed)
Attempted to reach patient to get scheduled for an appointment today with another provider.  LVM to call office back to get scheduled.  Put in CRM.

## 2022-10-24 NOTE — Progress Notes (Unsigned)
Acute Office Visit   Patient: Amanda Weeks   DOB: 04/17/1976   47 y.o. Female  MRN: [id number] Visit Date: 10/24/2022  Today's healthcare provider: Oswaldo Conroy Kairy Folsom, PA-C  Introduced myself to the patient as a Secondary school teacher and provided education on APPs in clinical practice.    Chief Complaint  Patient presents with   Nasal Congestion    X 2 weeks ago   Subjective    HPI HPI     Nasal Congestion    Additional comments: X 2 weeks ago      Last edited by Sherolyn Buba, CMA on 10/24/2022  3:22 PM.      Nasal congestion  Onset: gradual  Duration: ongoing for several weeks  Associated symptoms: Interventions: she is taking an OTC antihistamine twice per day, nasal saline sprays, and severe sinus congestion spray (oxymetazoline)  every few hours  She reports minimal benefit from these interventions and states the only thing that has helped was a steroid pack  She is taking zyrtec twice per day at this time  Patient was seen 09/16/22 via virtual visit and given medrol dose pack   Medications: Outpatient Medications Prior to Visit  Medication Sig   acetaminophen (TYLENOL) 500 MG tablet Take 1 tablet (500 mg total) by mouth every 6 (six) hours as needed.   amitriptyline (ELAVIL) 10 MG tablet Take 1 tablet (10 mg total) by mouth at bedtime.   Cholecalciferol 1.25 MG (50000 UT) TABS Take 1 tablet by mouth once a week.   levothyroxine (SYNTHROID) 25 MCG tablet Take 1 tablet (25 mcg total) by mouth daily.   rizatriptan (MAXALT) 5 MG tablet Take 1 tablet (5 mg total) by mouth as needed for migraine. May repeat in 2 hours if needed   traZODone (DESYREL) 50 MG tablet Take 0.5-1 tablets (25-50 mg total) by mouth at bedtime as needed for sleep.   [DISCONTINUED] methylPREDNISolone (MEDROL DOSEPAK) 4 MG TBPK tablet Take as directed   No facility-administered medications prior to visit.    Review of Systems  Constitutional:  Positive for fatigue. Negative for chills and  fever.  HENT:  Positive for congestion, postnasal drip and rhinorrhea.   Respiratory:  Negative for cough, shortness of breath and wheezing.     {Labs  Heme  Chem  Endocrine  Serology  Results Review (optional):23779}   Objective    BP 131/81   Pulse 89   Temp 97.9 F (36.6 C)   Ht 5' 2.01" (1.575 m)   Wt 196 lb (88.9 kg)   SpO2 98%   BMI 35.84 kg/m  {Show previous vital signs (optional):23777}  Physical Exam Vitals reviewed.  Constitutional:      General: She is awake.     Appearance: Normal appearance. She is well-developed and well-groomed.  HENT:     Head: Normocephalic and atraumatic.     Nose: Congestion present.     Mouth/Throat:     Lips: Pink.     Mouth: Mucous membranes are moist.  Musculoskeletal:     Cervical back: Normal range of motion and neck supple.  Neurological:     General: No focal deficit present.     Mental Status: She is alert and oriented to person, place, and time. Mental status is at baseline.  Psychiatric:        Mood and Affect: Mood normal.        Behavior: Behavior normal. Behavior is cooperative.  Thought Content: Thought content normal.        Judgment: Judgment normal.       No results found for any visits on 10/24/22.  Assessment & Plan      No follow-ups on file.       Problem List Items Addressed This Visit   None Visit Diagnoses     Sinus congestion    -  Primary   Relevant Medications   Olopatadine-Mometasone (RYALTRIS) 665-25 MCG/ACT SUSP   Seasonal allergic rhinitis, unspecified trigger       Relevant Medications   Olopatadine-Mometasone (RYALTRIS) 665-25 MCG/ACT SUSP   predniSONE (DELTASONE) 20 MG tablet        No follow-ups on file.   I, Shamikia Linskey E Kamrin Spath, PA-C, have reviewed all documentation for this visit. The documentation on 10/24/22 for the exam, diagnosis, procedures, and orders are all accurate and complete.   Jacquelin Hawking, MHS, PA-C [facility]

## 2022-10-24 NOTE — Patient Instructions (Addendum)
PLEASE STOP THE NASAL DECONGESTANT SPRAY  This is likely making your symptoms much worse and creating a dependency on the medication Stopping this spray will likely cause some worsening of your symptoms before it gets better  I have sent in a script for Ryaltris nasal spray- please use as directed  The manufacturer likely has a patient assistance program if there is a high cost so please go to the website below for help if needed  RYALTRIS - Nasal Spray  RYALTRIS is a 2-in-1 nasal spray that starts to deliver multitasking relief of symptoms within 15 minutes   I have sent in a Prednisone taper as well to help you through the transition. You can continue to take your antihistamine but please reduce this to once per day   If your symptoms are not improving in 2- 4 weeks please come back and we can discuss further options

## 2022-10-27 DIAGNOSIS — J302 Other seasonal allergic rhinitis: Secondary | ICD-10-CM | POA: Insufficient documentation

## 2022-10-27 NOTE — Assessment & Plan Note (Signed)
Acute, recurrent concern Patient reports significant seasonal allergies along with persistent rhinitis and nasal congestion. She states that she is taking an OTC antihistamine, Zyrtec, twice per day at this time without much relief.  She also reports using oxymetazoline nasal congestion spray every few hours. We reviewed that persistent use of nasal decongestant sprays can cause more severe rhinitis symptoms.  I recommend that she stops using this immediately. Given her current symptoms will provide a prednisone taper to assist with transition away from the nasal decongestant sprays. Recommend that she takes Zyrtec once per day as directed and per manufacturer's instructions. Will also provide a prescription for Ryaltris nasal spray given that she does not find much relief with Flonase in the past. We discussed return precautions.  Recommend that she gives these efforts at least 1 to 3 weeks before scheduling follow-up unless symptoms are progressing and becoming more severe. Follow-up as needed for progressing or persistent symptoms

## 2022-11-19 ENCOUNTER — Encounter: Payer: Self-pay | Admitting: Nurse Practitioner

## 2023-02-25 ENCOUNTER — Other Ambulatory Visit: Payer: Self-pay | Admitting: Nurse Practitioner

## 2023-02-26 NOTE — Telephone Encounter (Signed)
Requested Prescriptions  Pending Prescriptions Disp Refills   traZODone (DESYREL) 50 MG tablet [Pharmacy Med Name: traZODone HCl 50 MG Oral Tablet] 30 tablet 3    Sig: TAKE 1/2 TO 1 (ONE-HALF TO ONE) TABLET BY MOUTH AT BEDTIME AS NEEDED FOR SLEEP     Psychiatry: Antidepressants - Serotonin Modulator Passed - 02/25/2023  2:47 PM      Passed - Valid encounter within last 6 months    Recent Outpatient Visits           4 months ago Seasonal allergic rhinitis, unspecified trigger   [place] [facility] Mecum, Erin E, PA-C   5 months ago Sinus congestion   [place] [facility] Welsh, Sherran Needs, NP   6 months ago Hashimoto's thyroiditis   [place] [facility] [place], Hurricane T, NP   8 months ago Sinus congestion   [place] [facility] [place], Clydie Braun, NP   1 year ago Migraine without aura and without status migrainosus, not intractable   [facility] [place], Dorie Rank, NP

## 2023-07-12 IMAGING — DX DG CHEST 1V PORT
1 series · 1 of 1 positions shown · non-contrast
Comparison: Portable chest 05/12/2019 and earlier.

CLINICAL DATA: 44-year-old female with nonproductive cough and
shortness of breath for 1 week.

EXAM:
PORTABLE CHEST 1 VIEW

[chest ap]
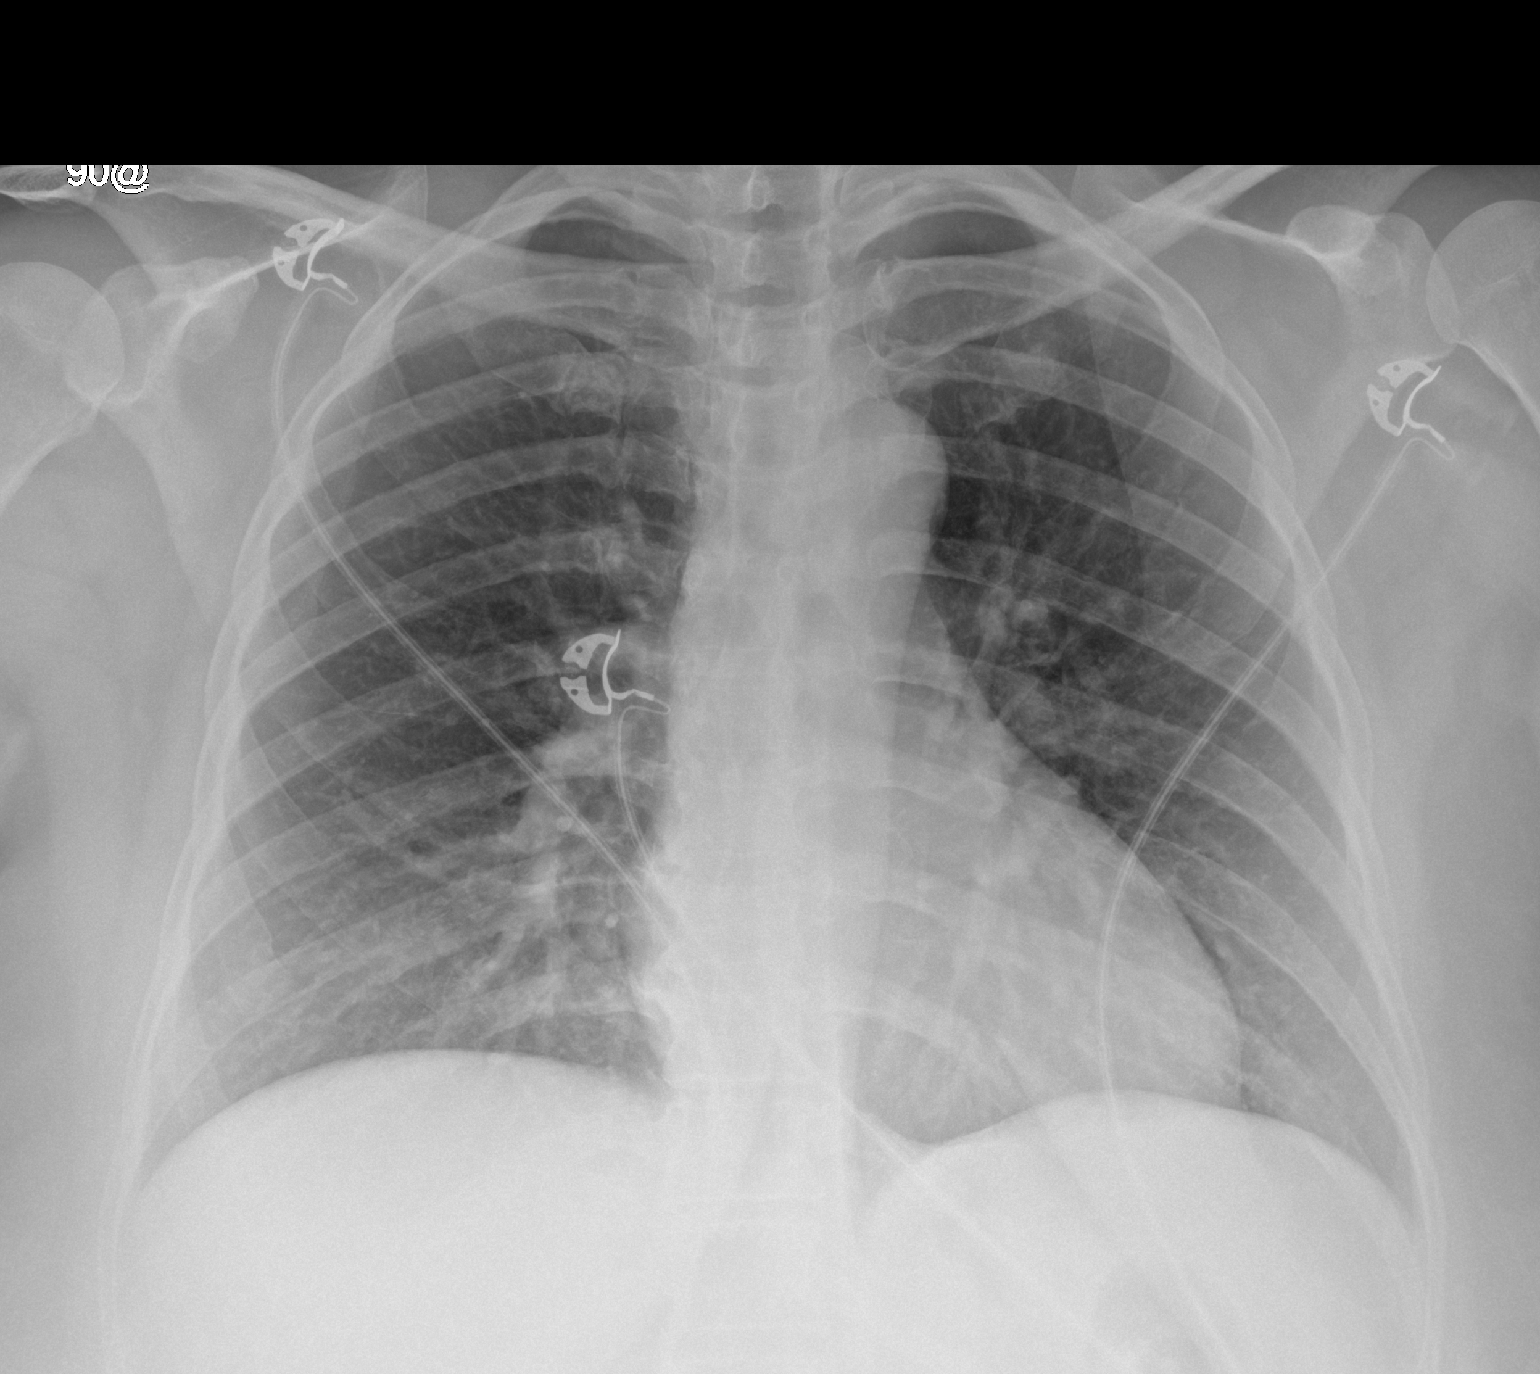

[1 of 1 positions shown; findings below may reference images not displayed]

FINDINGS: Portable AP upright view at 0177 hours. Improved lung volumes and
bibasilar ventilation compared to 1111. Normal cardiac size and
mediastinal contours. Visualized tracheal air column is within
normal limits. Allowing for portable technique the lungs are clear.
No pneumothorax or pleural effusion. No osseous abnormality
identified. Negative visible bowel gas pattern.
IMPRESSION: Negative portable chest.

## 2023-12-18 ENCOUNTER — Other Ambulatory Visit: Payer: Self-pay | Admitting: Nurse Practitioner

## 2023-12-21 NOTE — Telephone Encounter (Signed)
 Unable to refill per protocol, appointment needed.   Requested Prescriptions  Pending Prescriptions Disp Refills   traZODone  (DESYREL ) 50 MG tablet [Pharmacy Med Name: traZODone  HCl 50 MG Oral Tablet] 30 tablet 0    Sig: TAKE 1/2 TO 1 (ONE-HALF TO ONE) TABLET BY MOUTH AT BEDTIME AS NEEDED FOR SLEEP     Psychiatry: Antidepressants - Serotonin Modulator Failed - 12/21/2023  8:44 AM      Failed - Valid encounter within last 6 months    Recent Outpatient Visits   None             levothyroxine  (SYNTHROID ) 25 MCG tablet [Pharmacy Med Name: Levothyroxine  Sodium 25 MCG Oral Tablet] 90 tablet 0    Sig: Take 1 tablet by mouth once daily     Endocrinology:  Hypothyroid Agents Failed - 12/21/2023  8:44 AM      Failed - TSH in normal range and within 360 days    TSH  Date Value Ref Range Status  08/27/2022 3.670 0.450 - 4.500 uIU/mL Final         Failed - Valid encounter within last 12 months    Recent Outpatient Visits   None

## 2023-12-22 ENCOUNTER — Other Ambulatory Visit: Payer: Self-pay | Admitting: Nurse Practitioner

## 2023-12-23 NOTE — Telephone Encounter (Signed)
 Unable to refill per protocol, courtesy refill already given, OV needed.  Requested Prescriptions  Pending Prescriptions Disp Refills   levothyroxine  (SYNTHROID ) 25 MCG tablet [Pharmacy Med Name: Levothyroxine  Sodium 25 MCG Oral Tablet] 90 tablet 0    Sig: Take 1 tablet by mouth once daily     Endocrinology:  Hypothyroid Agents Failed - 12/23/2023  2:35 PM      Failed - TSH in normal range and within 360 days    TSH  Date Value Ref Range Status  08/27/2022 3.670 0.450 - 4.500 uIU/mL Final         Failed - Valid encounter within last 12 months    Recent Outpatient Visits   None             traZODone  (DESYREL ) 50 MG tablet [Pharmacy Med Name: traZODone  HCl 50 MG Oral Tablet] 30 tablet 0    Sig: TAKE 1/2 TO 1 (ONE-HALF TO ONE) TABLET BY MOUTH AT BEDTIME AS NEEDED FOR SLEEP     Psychiatry: Antidepressants - Serotonin Modulator Failed - 12/23/2023  2:35 PM      Failed - Valid encounter within last 6 months    Recent Outpatient Visits   None

## 2023-12-24 ENCOUNTER — Other Ambulatory Visit (HOSPITAL_COMMUNITY)
Admission: RE | Admit: 2023-12-24 | Discharge: 2023-12-24 | Disposition: A | Discharge status: Home / Self Care | Source: Ambulatory Visit | Attending: Obstetrics | Admitting: Obstetrics

## 2023-12-24 ENCOUNTER — Encounter: Payer: Self-pay | Admitting: Obstetrics

## 2023-12-24 ENCOUNTER — Ambulatory Visit: Payer: Self-pay | Admitting: Obstetrics

## 2023-12-24 VITALS — BP 120/73 | HR 78 | Ht 63.0 in | Wt 181.0 lb

## 2023-12-24 DIAGNOSIS — Z7689 Persons encountering health services in other specified circumstances: Secondary | ICD-10-CM

## 2023-12-24 DIAGNOSIS — Z124 Encounter for screening for malignant neoplasm of cervix: Secondary | ICD-10-CM | POA: Diagnosis not present

## 2023-12-24 DIAGNOSIS — N939 Abnormal uterine and vaginal bleeding, unspecified: Secondary | ICD-10-CM

## 2023-12-24 DIAGNOSIS — Z30017 Encounter for initial prescription of implantable subdermal contraceptive: Secondary | ICD-10-CM

## 2023-12-24 MED ORDER — ETONOGESTREL 68 MG ~~LOC~~ IMPL
68.0000 mg | DRUG_IMPLANT | Freq: Once | SUBCUTANEOUS | Status: AC
Start: 2023-12-24 — End: 2023-12-24
  Administered 2023-12-24: 68 mg via SUBCUTANEOUS

## 2023-12-24 NOTE — Telephone Encounter (Signed)
 Called patient left vm for patient to call and schedule overdue office visit.

## 2023-12-24 NOTE — Progress Notes (Signed)
 ENCOUNTER FOR NEXPLANON  INSERTION  SUBJECTIVE Amanda Weeks is a 48 y.o. [id number] who presents today for insertion of Nexplanon  contraceptive device. She has had a BTL but her periods have become heavier and more painful over the last year. Her periods have been normal up to this point. Now she is having significant pain with periods as well as increased bleeding. She would like hormonal cycle control. We discussed the various options available. We have thoroughly reviewed the risks, benefits, and alternatives, and she has elected to proceed with Nexplanon  insertion. She is also due for a Pap smear and would like to have that done today.  OBJECTIVE BP 120/73   Pulse 78   Ht 5' 3 (1.6 m)   Wt 181 lb (82.1 kg)   LMP 11/23/2023   BMI 32.06 kg/m   Pelvic exam: normal external genitalia, vulva, vagina, cervix, uterus and adnexa Pap collected .  Procedure Note Left Arm Sterile Preparation:  Chloraprep  Insertion site was selected 8 cm from the medial epicondyle. The procedure area was prepped in sterile fashion. Adequate anesthesia was achieved with 2 mL of 1% lidocaine  injected subcutaneously. The Nexplanon  applicator was inserted subcutaneously and the Nexplanon  device was delivered. The applicator was removed from the insertion site. The capsule was palpated by myself and the patient to confirm satisfactory placement. Blood loss was minimal. A pressure dressing was applied.  The patient tolerated the procedure well with no complications. Standard post-procedure care and return precautions were explained.   Assessment/Plan Heavy menstrual bleeding Nexplanon  placed Pelvic US  ordered  Due for Pap Pap collected. F/u based on results Declines STI testing  Eleanor Canny, CNM

## 2023-12-28 ENCOUNTER — Other Ambulatory Visit: Payer: Self-pay | Admitting: Obstetrics

## 2023-12-28 ENCOUNTER — Encounter: Payer: Self-pay | Admitting: Obstetrics

## 2023-12-28 DIAGNOSIS — B3731 Acute candidiasis of vulva and vagina: Secondary | ICD-10-CM

## 2023-12-28 LAB — CYTOLOGY - PAP
Adequacy: ABSENT
Comment: NEGATIVE
Diagnosis: NEGATIVE
High risk HPV: NEGATIVE

## 2023-12-28 MED ORDER — FLUCONAZOLE 150 MG PO TABS: 150.0000 mg | ORAL_TABLET | Freq: Once | ORAL | 1 refills | Status: AC

## 2023-12-28 NOTE — Telephone Encounter (Signed)
 Ok for E2C2 to review.   2nd attempted to reach patient to schedule needed appointment. If she returns call please assist.

## 2023-12-28 NOTE — Progress Notes (Signed)
+  yeast on Pap smear. Rx for Diflucan  sent.  Naydelin Ziegler M Shirleymae Hauth, CNM

## 2023-12-31 NOTE — Telephone Encounter (Signed)
 3rd attempt to reach patient. Left vm

## 2024-04-17 NOTE — Patient Instructions (Incomplete)

## 2024-04-20 ENCOUNTER — Encounter: Payer: Self-pay | Admitting: Nurse Practitioner

## 2024-04-20 ENCOUNTER — Ambulatory Visit (INDEPENDENT_AMBULATORY_CARE_PROVIDER_SITE_OTHER): Admitting: Nurse Practitioner

## 2024-04-20 VITALS — BP 121/75 | HR 87 | Temp 98.3°F | Ht 63.0 in | Wt 187.4 lb

## 2024-04-20 DIAGNOSIS — E559 Vitamin D deficiency, unspecified: Secondary | ICD-10-CM | POA: Diagnosis not present

## 2024-04-20 DIAGNOSIS — E063 Autoimmune thyroiditis: Secondary | ICD-10-CM

## 2024-04-20 DIAGNOSIS — Z1211 Encounter for screening for malignant neoplasm of colon: Secondary | ICD-10-CM | POA: Diagnosis not present

## 2024-04-20 DIAGNOSIS — E6609 Other obesity due to excess calories: Secondary | ICD-10-CM

## 2024-04-20 DIAGNOSIS — Z0000 Encounter for general adult medical examination without abnormal findings: Secondary | ICD-10-CM | POA: Diagnosis not present

## 2024-04-20 DIAGNOSIS — E7800 Pure hypercholesterolemia, unspecified: Secondary | ICD-10-CM | POA: Diagnosis not present

## 2024-04-20 DIAGNOSIS — G43009 Migraine without aura, not intractable, without status migrainosus: Secondary | ICD-10-CM

## 2024-04-20 DIAGNOSIS — R7303 Prediabetes: Secondary | ICD-10-CM

## 2024-04-20 DIAGNOSIS — Z1231 Encounter for screening mammogram for malignant neoplasm of breast: Secondary | ICD-10-CM | POA: Diagnosis not present

## 2024-04-20 DIAGNOSIS — L659 Nonscarring hair loss, unspecified: Secondary | ICD-10-CM

## 2024-04-20 DIAGNOSIS — E538 Deficiency of other specified B group vitamins: Secondary | ICD-10-CM

## 2024-04-20 DIAGNOSIS — F5101 Primary insomnia: Secondary | ICD-10-CM

## 2024-04-20 DIAGNOSIS — J301 Allergic rhinitis due to pollen: Secondary | ICD-10-CM

## 2024-04-20 LAB — MICROALBUMIN, URINE WAIVED
Creatinine, Urine Waived: 300 mg/dL (ref 10–300)
Microalb, Ur Waived: 80 mg/L — ABNORMAL HIGH (ref 0–19)

## 2024-04-20 LAB — BAYER DCA HB A1C WAIVED: HB A1C (BAYER DCA - WAIVED): 5.6 % (ref 4.8–5.6)

## 2024-04-20 MED ORDER — RIZATRIPTAN BENZOATE 5 MG PO TABS: 5.0000 mg | ORAL_TABLET | ORAL | 1 refills | Status: DC | PRN

## 2024-04-20 MED ORDER — TRAZODONE HCL 100 MG PO TABS: 100.0000 mg | ORAL_TABLET | Freq: Every day | ORAL | 3 refills | Status: AC

## 2024-04-20 MED ORDER — RYALTRIS 665-25 MCG/ACT NA SUSP: 2.0000 | Freq: Two times a day (BID) | NASAL | 2 refills | Status: DC

## 2024-04-20 MED ORDER — LEVOTHYROXINE SODIUM 25 MCG PO TABS: 25.0000 ug | ORAL_TABLET | Freq: Every day | ORAL | 4 refills | Status: AC

## 2024-04-20 MED ORDER — MONTELUKAST SODIUM 10 MG PO TABS: 10.0000 mg | ORAL_TABLET | Freq: Every day | ORAL | 3 refills | Status: AC

## 2024-04-20 NOTE — Assessment & Plan Note (Signed)
 Chronic and ongoing. Poor control triggers migraines. OTC medications offer no benefit. Will send refills on Ryaltris  spray and trial Singulair  nightly. Educated her on this at length and BLACK BOX warning. May benefit allergy testing in future and possibly allergy shots if ongoing issues.

## 2024-04-20 NOTE — Assessment & Plan Note (Signed)
 BMI 33.20.  Recommended eating smaller high protein, low fat meals more frequently and exercising 30 mins a day 5 times a week with a goal of 10-15lb weight loss in the next 3 months. Patient voiced their understanding and motivation to adhere to these recommendations.

## 2024-04-20 NOTE — Progress Notes (Signed)
 BP 121/75   Pulse 87   Temp 98.3 F (36.8 C) (Oral)   Ht 5' 3 (1.6 m)   Wt 187 lb 6.4 oz (85 kg)   SpO2 98%   BMI 33.20 kg/m    Subjective:    Patient ID: Amanda Weeks, female    DOB: Jan 02, 1976, 48 y.o.   MRN: [id number]  HPI: Amanda Weeks is a 48 y.o. female presenting on 04/20/2024 for comprehensive medical examination. Current medical complaints include:none  She currently lives with: family Menopausal Symptoms: no  MIGRAINES Gets more often when sleep deprived. Amitriptyline  did not offer benefit. Her allergies and lack of sleep trigger these. Is out of nasal spray. Has tried multiple OTC allergy medications with no benefit. Duration: chronic Onset: gradual Severity: 10/10 Quality: dull, aching, and throbbing Frequency: intermittent Location: frontal at times and sometimes posterior Headache duration: 24 to 72 hours Radiation: no Time of day headache occurs: varies Alleviating factors: Tylenol  and Ibuprofen  + good sleep medication Aggravating factors: poor sleep Headache status at time of visit: asymptomatic Treatments attempted: rest, ice, heat, APAP, ibuprofen , and amitriptyline , Trazodone  Aura: no Nausea:  minimal Vomiting: no Photophobia:  yes Phonophobia:  no Effect on social functioning:  no Numbers of missed days of school/work each month: none Confusion:  no Gait disturbance/ataxia:  no Behavioral changes:  no Fevers:  no  HYPOTHYROIDISM Has been off Levothyroxine  for a little while. Forgot refills. Thyroid  control status:uncontrolled Satisfied with current treatment? yes Medication side effects: no Medication compliance: poor compliance Etiology of hypothyroidism: Hashimoto's Recent dose adjustment:no Fatigue: no Cold intolerance: yes Heat intolerance: no Weight gain: no Weight loss: no Constipation: no Diarrhea/loose stools: no Palpitations: no Lower extremity edema: no Anxiety/depressed mood: no   Impaired Fasting  Glucose Has cut back on sodas. Low B12 and Vitamin D  levels in past, no current supplements. HbA1C:  Lab Results  Component Value Date   HGBA1C 5.6 04/20/2024  Duration of elevated blood sugar: chronic Polydipsia: no Polyuria: no Weight change: no Visual disturbance: no Glucose Monitoring: no    Accucheck frequency: Not Checking    Fasting glucose:     Post prandial:  Diabetic Education: Not Completed Family history of diabetes: yes   INSOMNIA Was taking 100 MG Trazodone  prior to running out. Only takes on days she works due to work hours. Duration: chronic Satisfied with sleep quality: yes Difficulty falling asleep: yes Difficulty staying asleep: yes Waking a few hours after sleep onset: yes Early morning awakenings: yes Daytime hypersomnolence: no Wakes feeling refreshed: no Good sleep hygiene: yes Apnea: no Snoring: yes Depressed/anxious mood: no Recent stress: no Restless legs/nocturnal leg cramps: yes Chronic pain/arthritis: no History of sleep study: no Treatments attempted: melatonin and benadryl  , Trazodone      05/17/2019   10:45 AM 11/28/2019    2:17 PM 02/03/2021    7:13 AM 02/06/2022    8:15 AM 04/20/2024    3:12 PM  Fall Risk  Falls in the past year?    0 0  Was there an injury with Fall?    0 0  Fall Risk Category Calculator    0 0  Fall Risk Category (Retired)    Low    (RETIRED) Patient Fall Risk Level Moderate fall risk  Low fall risk  Low fall risk  Low fall risk    Patient at Risk for Falls Due to    No Fall Risks No Fall Risks  Fall risk Follow up    Falls  evaluation completed  Falls evaluation completed     Data saved with a previous flowsheet row definition    Depression Screen done today and results listed below:     02/06/2022    8:15 AM  Depression screen PHQ 2/9  Decreased Interest 0  Down, Depressed, Hopeless 0  PHQ - 2 Score 0  Altered sleeping 3  Tired, decreased energy 2  Change in appetite 0  Feeling bad or failure about  yourself  0  Trouble concentrating 0  Moving slowly or fidgety/restless 0  Suicidal thoughts 0  PHQ-9 Score 5   Difficult doing work/chores Somewhat difficult     Data saved with a previous flowsheet row definition      02/06/2022    8:16 AM  GAD 7 : Generalized Anxiety Score  Nervous, Anxious, on Edge 0  Control/stop worrying 0  Worry too much - different things 0  Trouble relaxing 0  Restless 0  Easily annoyed or irritable 0  Afraid - awful might happen 0  Total GAD 7 Score 0   Past Medical History:  Past Medical History:  Diagnosis Date   Migraines     Surgical History:  Past Surgical History:  Procedure Laterality Date   CESAREAN SECTION      Medications:  Current Outpatient Medications on File Prior to Visit  Medication Sig   acetaminophen  (TYLENOL ) 500 MG tablet Take 1 tablet (500 mg total) by mouth every 6 (six) hours as needed.   No current facility-administered medications on file prior to visit.    Allergies:  Allergies  Allergen Reactions   Tizanidine  Other (See Comments)    Hallucinations    Social History:  Social History   Socioeconomic History   Marital status: Married    Spouse name: Not on file   Number of children: 3   Years of education: Not on file   Highest education level: Not on file  Occupational History   Not on file  Tobacco Use   Smoking status: Never   Smokeless tobacco: Never  Vaping Use   Vaping status: Never Used  Substance and Sexual Activity   Alcohol use: Yes    Alcohol/week: 0.0 standard drinks of alcohol    Comment: Occasional   Drug use: Never   Sexual activity: Yes    Comment: BTL  Other Topics Concern   Not on file  Social History Narrative   Not on file   Social Drivers of Health   Financial Resource Strain: Low Risk  (02/06/2022)   Overall Financial Resource Strain (CARDIA)    Difficulty of Paying Living Expenses: Not hard at all  Food Insecurity: No Food Insecurity (02/06/2022)   Hunger Vital  Sign    Worried About Running Out of Food in the Last Year: Never true    Ran Out of Food in the Last Year: Never true  Transportation Needs: No Transportation Needs (02/06/2022)   PRAPARE - Administrator, Civil Service (Medical): No    Lack of Transportation (Non-Medical): No  Physical Activity: Inactive (02/06/2022)   Exercise Vital Sign    Days of Exercise per Week: 0 days    Minutes of Exercise per Session: 0 min  Stress: No Stress Concern Present (02/06/2022)   [employer] of Occupational Health - Occupational Stress Questionnaire    Feeling of Stress : Only a little  Social Connections: Moderately Isolated (02/06/2022)   Social Connection and Isolation Panel    Frequency of Communication with Friends  and Family: More than three times a week    Frequency of Social Gatherings with Friends and Family: More than three times a week    Attends Religious Services: Never    Database Administrator or Organizations: No    Attends Banker Meetings: Never    Marital Status: Married  Catering Manager Violence: Not At Risk (02/06/2022)   Humiliation, Afraid, Rape, and Kick questionnaire    Fear of Current or Ex-Partner: No    Emotionally Abused: No    Physically Abused: No    Sexually Abused: No   Social History   Tobacco Use  Smoking Status Never  Smokeless Tobacco Never   Social History   Substance and Sexual Activity  Alcohol Use Yes   Alcohol/week: 0.0 standard drinks of alcohol   Comment: Occasional    Family History:  Family History  Problem Relation Age of Onset   Diabetes Mother    Atrial fibrillation Mother    Diabetes Maternal Grandmother    Brain cancer Maternal Grandmother    Atrial fibrillation Maternal Grandmother    Aneurysm Maternal Grandfather     Past medical history, surgical history, medications, allergies, family history and social history reviewed with patient today and changes made to appropriate areas of the chart.    ROS All other ROS negative except what is listed above and in the HPI.      Objective:    BP 121/75   Pulse 87   Temp 98.3 F (36.8 C) (Oral)   Ht 5' 3 (1.6 m)   Wt 187 lb 6.4 oz (85 kg)   SpO2 98%   BMI 33.20 kg/m   Wt Readings from Last 3 Encounters:  04/20/24 187 lb 6.4 oz (85 kg)  12/24/23 181 lb (82.1 kg)  10/24/22 196 lb (88.9 kg)    Physical Exam Vitals and nursing note reviewed.  Constitutional:      General: She is awake. She is not in acute distress.    Appearance: She is well-developed and well-groomed. She is obese. She is not ill-appearing or toxic-appearing.  HENT:     Head: Normocephalic and atraumatic.     Right Ear: Hearing, tympanic membrane, ear canal and external ear normal. No drainage.     Left Ear: Hearing, tympanic membrane, ear canal and external ear normal. No drainage.     Nose: Nose normal.     Right Sinus: No maxillary sinus tenderness or frontal sinus tenderness.     Left Sinus: No maxillary sinus tenderness or frontal sinus tenderness.     Mouth/Throat:     Mouth: Mucous membranes are moist.     Pharynx: Oropharynx is clear. Uvula midline. No pharyngeal swelling, oropharyngeal exudate or posterior oropharyngeal erythema.  Eyes:     General: Lids are normal.        Right eye: No discharge.        Left eye: No discharge.     Extraocular Movements: Extraocular movements intact.     Conjunctiva/sclera: Conjunctivae normal.     Pupils: Pupils are equal, round, and reactive to light.     Visual Fields: Right eye visual fields normal and left eye visual fields normal.  Neck:     Thyroid : No thyromegaly.     Vascular: No carotid bruit.     Trachea: Trachea normal.  Cardiovascular:     Rate and Rhythm: Normal rate and regular rhythm.     Heart sounds: Normal heart sounds. No murmur heard.  No gallop.  Pulmonary:     Effort: Pulmonary effort is normal. No accessory muscle usage or respiratory distress.     Breath sounds: Normal breath  sounds.  Chest:     Comments: Deferred per request. Abdominal:     General: Bowel sounds are normal.     Palpations: Abdomen is soft. There is no hepatomegaly or splenomegaly.     Tenderness: There is no abdominal tenderness.  Musculoskeletal:        General: Normal range of motion.     Cervical back: Normal range of motion and neck supple.     Right lower leg: No edema.     Left lower leg: No edema.  Lymphadenopathy:     Head:     Right side of head: No submental, submandibular, tonsillar, preauricular or posterior auricular adenopathy.     Left side of head: No submental, submandibular, tonsillar, preauricular or posterior auricular adenopathy.     Cervical: No cervical adenopathy.  Skin:    General: Skin is warm and dry.     Capillary Refill: Capillary refill takes less than 2 seconds.     Findings: No rash.  Neurological:     Mental Status: She is alert and oriented to person, place, and time.     Gait: Gait is intact.     Deep Tendon Reflexes: Reflexes are normal and symmetric.     Reflex Scores:      Brachioradialis reflexes are 2+ on the right side and 2+ on the left side.      Patellar reflexes are 2+ on the right side and 2+ on the left side. Psychiatric:        Attention and Perception: Attention normal.        Mood and Affect: Mood normal.        Speech: Speech normal.        Behavior: Behavior normal. Behavior is cooperative.        Thought Content: Thought content normal.        Judgment: Judgment normal.    Results for orders placed or performed in visit on 04/20/24  Bayer DCA Hb A1c Waived   Collection Time: 04/20/24  3:12 PM  Result Value Ref Range   HB A1C (BAYER DCA - WAIVED) 5.6 4.8 - 5.6 %  Microalbumin, Urine Waived   Collection Time: 04/20/24  3:12 PM  Result Value Ref Range   Microalb, Ur Waived 80 (H) 0 - 19 mg/L   Creatinine, Urine Waived 300 10 - 300 mg/dL   Microalb/Creat Ratio 30-300 (H) <30 mg/g      Assessment & Plan:   Problem List  Items Addressed This Visit       Cardiovascular and Mediastinum   Migraine headache   Chronic, ongoing.  No red flags.  Will continue Maxalt  for acute migraines, if elevation in heart rate with this (which she had with Imitrex) then will consider change to Nurtec.  Recommend she start taking Magnesium  400 MG every night.  Continue Trazodone  for sleep, lack of sleep is trigger for her, and allergy medications as ordered.      Relevant Medications   rizatriptan  (MAXALT ) 5 MG tablet   traZODone  (DESYREL ) 100 MG tablet   Other Relevant Orders   Ferritin   Iron     Respiratory   Seasonal allergic rhinitis   Chronic and ongoing. Poor control triggers migraines. OTC medications offer no benefit. Will send refills on Ryaltris  spray and trial Singulair  nightly. Educated her  on this at length and BLACK BOX warning. May benefit allergy testing in future and possibly allergy shots if ongoing issues.      Relevant Medications   Olopatadine-Mometasone (RYALTRIS ) 665-25 MCG/ACT SUSP     Endocrine   Hashimoto's thyroiditis - Primary   Chronic, ongoing, not taking medication consistently - has been off for months.  Highly recommend she restart and take daily as ordered with no missed doses.  Thyroid  labs today and adjust regimen as needed.      Relevant Medications   levothyroxine  (SYNTHROID ) 25 MCG tablet   Other Relevant Orders   T4, free   TSH     Other   Vitamin D  deficiency   Noted on past labs, recheck today and initiate supplement as needed.      Relevant Orders   VITAMIN D  25 Hydroxy (Vit-D Deficiency, Fractures)   Vitamin B12 deficiency   Noted on past labs, recheck today and initiate supplement as needed.      Relevant Orders   Ferritin   Iron   CBC with Differential/Platelet   Vitamin B12   Prediabetes   A1c 5.6% today which is trend down from 6%. Urine ALB 80 November 2025. Continue focus on healthy diet and regular exercise.      Relevant Orders   Bayer DCA Hb A1c  Waived (Completed)   Microalbumin, Urine Waived (Completed)   Obesity   BMI 33.20.  Recommended eating smaller high protein, low fat meals more frequently and exercising 30 mins a day 5 times a week with a goal of 10-15lb weight loss in the next 3 months. Patient voiced their understanding and motivation to adhere to these recommendations.       Insomnia   Chronic, ongoing, works night shift.  Increase Trazodone  to 100 MG nightly which works well for her, adjust as needed based on benefit.  Recommend Magnesium  400 MG prior to bed.  May benefit from sleep study in future.      Hair loss   Referral to dermatology per request, she is concerned as her mother and grandmother have the same issue.      Relevant Orders   Ambulatory referral to Dermatology   Elevated low density lipoprotein (LDL) cholesterol level   Noted on past labs, recheck today and initiate medication as needed. For now focus on healthy diet and regular exercise. The 10-year ASCVD risk score (Arnett DK, et al., 2019) is: 1.8%   Values used to calculate the score:     Age: 62 years     Clincally relevant sex: Female     Is Non-Hispanic African American: No     Diabetic: No     Tobacco smoker: No     Systolic Blood Pressure: 121 mmHg     Is BP treated: No     HDL Cholesterol: 31 mg/dL     Total Cholesterol: 198 mg/dL       Relevant Orders   Comprehensive metabolic panel with GFR   Lipid Panel w/o Chol/HDL Ratio   Other Visit Diagnoses       Encounter for screening mammogram for malignant neoplasm of breast       Mammogram ordered and instructed how to schedule.   Relevant Orders   MM 3D SCREENING MAMMOGRAM BILATERAL BREAST     Colon cancer screening       GI referral placed.   Relevant Orders   Ambulatory referral to Gastroenterology     Encounter for annual physical exam  Annual physical today with labs and health maintenance reviewed, discussed with patient.        Follow up plan: Return in  about 6 months (around 10/18/2024) for Hypothyroid.   LABORATORY TESTING:  - Pap smear: up to date  IMMUNIZATIONS:   - Tdap: Tetanus vaccination status reviewed: last tetanus booster within 10 years. - Influenza: Up to date - Pneumovax: Not applicable - Prevnar: Not applicable - COVID: Up to date - HPV: Not applicable - Shingrix vaccine: Not applicable  SCREENING: -Mammogram: Ordered today  - Colonoscopy: Ordered today  - Bone Density: Not applicable  -Hearing Test: Not applicable  -Spirometry: Not applicable   PATIENT COUNSELING:   Advised to take 1 mg of folate supplement per day if capable of pregnancy.   Sexuality: Discussed sexually transmitted diseases, partner selection, use of condoms, avoidance of unintended pregnancy  and contraceptive alternatives.   Advised to avoid cigarette smoking.  I discussed with the patient that most people either abstain from alcohol or drink within safe limits (<=14/week and <=4 drinks/occasion for males, <=7/weeks and <= 3 drinks/occasion for females) and that the risk for alcohol disorders and other health effects rises proportionally with the number of drinks per week and how often a drinker exceeds daily limits.  Discussed cessation/primary prevention of drug use and availability of treatment for abuse.   Diet: Encouraged to adjust caloric intake to maintain  or achieve ideal body weight, to reduce intake of dietary saturated fat and total fat, to limit sodium intake by avoiding high sodium foods and not adding table salt, and to maintain adequate dietary potassium and calcium preferably from fresh fruits, vegetables, and low-fat dairy products.    Stressed the importance of regular exercise  Injury prevention: Discussed safety belts, safety helmets, smoke detector, smoking near bedding or upholstery.   Dental health: Discussed importance of regular tooth brushing, flossing, and dental visits.    NEXT PREVENTATIVE PHYSICAL DUE IN 1  YEAR. Return in about 6 months (around 10/18/2024) for Hypothyroid.

## 2024-04-20 NOTE — Assessment & Plan Note (Signed)
Noted on past labs, recheck today and initiate supplement as needed.   

## 2024-04-20 NOTE — Assessment & Plan Note (Signed)
 Referral to dermatology per request, she is concerned as her mother and grandmother have the same issue.

## 2024-04-20 NOTE — Assessment & Plan Note (Signed)
 Chronic, ongoing.  No red flags.  Will continue Maxalt  for acute migraines, if elevation in heart rate with this (which she had with Imitrex) then will consider change to Nurtec.  Recommend she start taking Magnesium  400 MG every night.  Continue Trazodone  for sleep, lack of sleep is trigger for her, and allergy medications as ordered.

## 2024-04-20 NOTE — Assessment & Plan Note (Signed)
 A1c 5.6% today which is trend down from 6%. Urine ALB 80 November 2025. Continue focus on healthy diet and regular exercise.

## 2024-04-20 NOTE — Assessment & Plan Note (Addendum)
 Chronic, ongoing, not taking medication consistently - has been off for months.  Highly recommend she restart and take daily as ordered with no missed doses.  Thyroid  labs today and adjust regimen as needed.

## 2024-04-20 NOTE — Assessment & Plan Note (Signed)
 Noted on past labs, recheck today and initiate medication as needed. For now focus on healthy diet and regular exercise. The 10-year ASCVD risk score (Arnett DK, et al., 2019) is: 1.8%   Values used to calculate the score:     Age: 48 years     Clincally relevant sex: Female     Is Non-Hispanic African American: No     Diabetic: No     Tobacco smoker: No     Systolic Blood Pressure: 121 mmHg     Is BP treated: No     HDL Cholesterol: 31 mg/dL     Total Cholesterol: 198 mg/dL

## 2024-04-20 NOTE — Assessment & Plan Note (Signed)
 Chronic, ongoing, works night shift.  Increase Trazodone  to 100 MG nightly which works well for her, adjust as needed based on benefit.  Recommend Magnesium  400 MG prior to bed.  May benefit from sleep study in future.

## 2024-04-21 LAB — CBC WITH DIFFERENTIAL/PLATELET
Basophils Absolute: 0.1 x10E3/uL (ref 0.0–0.2)
Basos: 1 %
EOS (ABSOLUTE): 0.2 x10E3/uL (ref 0.0–0.4)
Eos: 2 %
Hematocrit: 32.5 % — ABNORMAL LOW (ref 34.0–46.6)
Hemoglobin: 10.5 g/dL — ABNORMAL LOW (ref 11.1–15.9)
Immature Grans (Abs): 0 x10E3/uL (ref 0.0–0.1)
Immature Granulocytes: 0 %
Lymphocytes Absolute: 2.6 x10E3/uL (ref 0.7–3.1)
Lymphs: 29 %
MCH: 24.6 pg — ABNORMAL LOW (ref 26.6–33.0)
MCHC: 32.3 g/dL (ref 31.5–35.7)
MCV: 76 fL — ABNORMAL LOW (ref 79–97)
Monocytes Absolute: 0.6 x10E3/uL (ref 0.1–0.9)
Monocytes: 6 %
Neutrophils Absolute: 5.5 x10E3/uL (ref 1.4–7.0)
Neutrophils: 62 %
Platelets: 355 x10E3/uL (ref 150–450)
RBC: 4.26 x10E6/uL (ref 3.77–5.28)
RDW: 14.7 % (ref 11.7–15.4)
WBC: 8.9 x10E3/uL (ref 3.4–10.8)

## 2024-04-21 LAB — COMPREHENSIVE METABOLIC PANEL WITH GFR
ALT: 9 IU/L (ref 0–32)
AST: 12 IU/L (ref 0–40)
Albumin: 4.1 g/dL (ref 3.9–4.9)
Alkaline Phosphatase: 63 IU/L (ref 41–116)
BUN/Creatinine Ratio: 16 (ref 9–23)
BUN: 10 mg/dL (ref 6–24)
Bilirubin Total: 0.4 mg/dL (ref 0.0–1.2)
CO2: 23 mmol/L (ref 20–29)
Calcium: 9 mg/dL (ref 8.7–10.2)
Chloride: 104 mmol/L (ref 96–106)
Creatinine, Ser: 0.61 mg/dL (ref 0.57–1.00)
Globulin, Total: 2.6 g/dL (ref 1.5–4.5)
Glucose: 100 mg/dL — ABNORMAL HIGH (ref 70–99)
Potassium: 3.7 mmol/L (ref 3.5–5.2)
Sodium: 139 mmol/L (ref 134–144)
Total Protein: 6.7 g/dL (ref 6.0–8.5)
eGFR: 112 mL/min/1.73 (ref >59)

## 2024-04-21 LAB — LIPID PANEL W/O CHOL/HDL RATIO
Cholesterol, Total: 204 mg/dL — ABNORMAL HIGH (ref 100–199)
HDL: 31 mg/dL — ABNORMAL LOW (ref >39)
LDL Chol Calc (NIH): 142 mg/dL — ABNORMAL HIGH (ref 0–99)
Triglycerides: 168 mg/dL — ABNORMAL HIGH (ref 0–149)
VLDL Cholesterol Cal: 31 mg/dL (ref 5–40)

## 2024-04-21 LAB — VITAMIN B12: Vitamin B-12: 168 pg/mL — ABNORMAL LOW (ref 232–1245)

## 2024-04-21 LAB — TSH: TSH: 6.64 u[IU]/mL — ABNORMAL HIGH (ref 0.450–4.500)

## 2024-04-21 LAB — VITAMIN D 25 HYDROXY (VIT D DEFICIENCY, FRACTURES): Vit D, 25-Hydroxy: 21.6 ng/mL — ABNORMAL LOW (ref 30.0–100.0)

## 2024-04-21 LAB — FERRITIN: Ferritin: 16 ng/mL (ref 15–150)

## 2024-04-21 LAB — T4, FREE: Free T4: 0.93 ng/dL (ref 0.82–1.77)

## 2024-04-21 LAB — IRON: Iron: 26 ug/dL — ABNORMAL LOW (ref 27–159)

## 2024-04-22 ENCOUNTER — Ambulatory Visit: Payer: Self-pay | Admitting: Nurse Practitioner

## 2024-04-22 DIAGNOSIS — D509 Iron deficiency anemia, unspecified: Secondary | ICD-10-CM | POA: Insufficient documentation

## 2024-04-22 DIAGNOSIS — D508 Other iron deficiency anemias: Secondary | ICD-10-CM

## 2024-04-22 NOTE — Progress Notes (Signed)
 Contacted via MyChart  Good morning Amanda Weeks, your labs have returned: - CBC is showing hemoglobin and hematocrit at little on lower side, this goes along with your iron being a bit low and B12 being quite low (would like to see this >300). If you are feeling fatigue or have brain fog, this could be part of the reason why. I higly recommend you start over the counter iron supplement like Vitron or Slow Fe daily. For B12 we have two options, either oral supplement over the counter (1000 MCG) daily) or you could come to office for monthly B12 injection. Which would you prefer? - Kidney function, creatinine and eGFR, remains normal, as is liver function, AST and ALT.  - TSH is elevated as expected, but Free T4 normal. Restart your Levothyroxine  daily. Try not to miss doses. Take first thing in the morning, 30 minutes prior to eating or taking any other medications. - Lipid panel continues to show elevations. No medications needed. Focus heavily on healthy diet and regular exercise. - Vitamin D  level low, please ensure to take Vitamin D3 2000 units daily. You can obtain this over the counter. - A1c shows no diabetes or prediabetes. Urine has some protein in it. We will continue to monitor this closely. - Remainder of labs stable. Any questions? Keep being amazing!!  Thank you for allowing me to participate in your care.  I appreciate you. Kindest regards, Tavonna Worthington

## 2024-04-27 ENCOUNTER — Encounter: Payer: Self-pay | Admitting: Nurse Practitioner

## 2024-04-27 MED ORDER — RIZATRIPTAN BENZOATE 5 MG PO TABS: 5.0000 mg | ORAL_TABLET | ORAL | 1 refills | Status: DC | PRN

## 2024-04-27 MED ORDER — FLUTICASONE PROPIONATE 50 MCG/ACT NA SUSP: 2.0000 | Freq: Every day | NASAL | 6 refills | Status: DC

## 2024-05-10 MED ORDER — AZELASTINE-FLUTICASONE 137-50 MCG/ACT NA SUSP: 1.0000 | Freq: Two times a day (BID) | NASAL | 5 refills | Status: AC

## 2024-05-10 MED ORDER — RIZATRIPTAN BENZOATE 5 MG PO TABS: 5.0000 mg | ORAL_TABLET | ORAL | 1 refills | Status: AC | PRN

## 2024-05-10 NOTE — Addendum Note (Signed)
 Addended by: Emric Kowalewski T on: 05/10/2024 08:13 AM   Modules accepted: Orders

## 2024-05-11 ENCOUNTER — Telehealth: Payer: Self-pay

## 2024-05-11 NOTE — Telephone Encounter (Signed)
 Received a message from the nurse requesting to schedule the patient for an office visit. Attempted to contact the patient to schedule the appointment; there was no answer. A voicemail was left requesting that the patient call back to schedule.

## 2024-05-23 NOTE — Telephone Encounter (Signed)
 Deshana, can you please try to contact the patient again and schedule her an appointment with any provider for Dysphagia. Thank you.

## 2024-10-19 ENCOUNTER — Ambulatory Visit: Admitting: Nurse Practitioner
